# Patient Record
Sex: Female | Born: 2000 | Race: White | Hispanic: No | Marital: Single | Smoking: Never smoker
Health system: Southern US, Community
[De-identification: ages and names within clinical notes are randomized; demographics above are authoritative.]

## PROBLEM LIST (undated history)

## (undated) DIAGNOSIS — F419 Anxiety disorder, unspecified: Secondary | ICD-10-CM

## (undated) DIAGNOSIS — F32A Depression, unspecified: Secondary | ICD-10-CM

---
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
## 2021-01-09 ENCOUNTER — Other Ambulatory Visit: Payer: Self-pay

## 2021-01-09 ENCOUNTER — Encounter: Payer: Self-pay | Admitting: Behavioral Health

## 2021-01-09 ENCOUNTER — Ambulatory Visit: Payer: BC Managed Care – PPO | Admitting: Behavioral Health

## 2021-01-09 DIAGNOSIS — F331 Major depressive disorder, recurrent, moderate: Secondary | ICD-10-CM | POA: Diagnosis not present

## 2021-01-09 DIAGNOSIS — F411 Generalized anxiety disorder: Secondary | ICD-10-CM | POA: Diagnosis not present

## 2021-01-09 NOTE — Progress Notes (Signed)
[facility]  Patient ID: Leslie Gonzalez,  MRN: [id number]  PCP: Gweneth Dimitri, MD  Date of Evaluation: 01/09/2021 Time spent:30 minutes  Chief Complaint:   HISTORY/CURRENT STATUS: HPI 20 year old female presents to this office for follow up and medication management. Her mother Tora Duck is present with her consent. Says she continues to experience depression. She say she has mostly bad days with occasional really good day thrown in. She says she really cannot identify any triggers for the depression, but says she constantly feels the need to be in a relationship. Says she craves nurturing attention and typically looks for it ina relationship with boys. Says she realized that she needs therapy and does not want medication adjustment at this time.  Says she has had some thought of SI. She says she questions "what is the purpose, why do I endure this". She says she does not have a plan and would not do it because of the love for her family. She reports depression 7/10 and anxiety 4/10. Says she is sleeping 7-8 hours per night. She is working  job while out of school for summer. No mania, no psychosis. No HI.   Past psychiatric medication failure: Lexapro 20 mg  Individual Medical History/ Review of Systems: Changes? :No   Allergies: Patient has no known allergies.  Current Medications:  Current Outpatient Medications:    busPIRone (BUSPAR) 10 MG tablet, Take 1 tablet (10 mg total) by mouth 2 (two) times daily after a meal., Disp: 180 tablet, Rfl: 3   norgestimate-ethinyl estradiol (ORTHO-CYCLEN) 0.25-35 MG-MCG tablet, 1 tablet, Disp: , Rfl:    sertraline (ZOLOFT) 100 MG tablet, Take 1 tablet (100 mg total) by mouth daily., Disp: 30 tablet, Rfl: 1   sertraline (ZOLOFT) 50 MG tablet, 1/2 tab 25 mg for 7 days. Then one whole tab. (Patient not taking: Reported on 01/09/2021), Disp: 30 tablet, Rfl: 1 Medication Side Effects: none  Family Medical/ Social History: Changes?  No  MENTAL HEALTH EXAM:  There were no vitals taken for this visit.There is no height or weight on file to calculate BMI.  General Appearance: Casual, Neat, and Well Groomed  Eye Contact:  Good  Speech:  Clear and Coherent  Volume:  Decreased  Mood:  Depressed  Affect:  Depressed and Flat  Thought Process:  Coherent  Orientation:  Full (Time, Place, and Person)  Thought Content: Logical   Suicidal Thoughts:  Yes.  without intent/plan  Homicidal Thoughts:  No  Memory:  WNL  Judgement:  Good  Insight:  Good  Psychomotor Activity:  Normal  Concentration:  Concentration: Good  Recall:  Good  Fund of Knowledge: Good  Language: Good  Assets:  Desire for Improvement  ADL's:  Intact  Cognition: WNL  Prognosis:  Good    DIAGNOSES:    ICD-10-CM   1. Generalized anxiety disorder  F41.1     2. Major depressive disorder, recurrent episode, moderate (HCC)  F33.1       Receiving Psychotherapy: No    RECOMMENDATIONS:  Continue Zoloft to 100 mg daily Will report any side effects or worsening symptoms promptly. To Follow up in 4 weeks to reassess. Greater than 50% of 30 min face to face time with patient was spent on counseling and coordination of care. We discussed patient's current anxiety and depression levels. Patient could not identify any current triggers for recent increase in depression. Patient does not want to adjust medications at this time and is interested in finding  a therapist.   Joan Flores, NP

## 2021-01-10 ENCOUNTER — Other Ambulatory Visit: Payer: Self-pay | Admitting: Behavioral Health

## 2021-01-10 DIAGNOSIS — F331 Major depressive disorder, recurrent, moderate: Secondary | ICD-10-CM

## 2021-01-10 DIAGNOSIS — F411 Generalized anxiety disorder: Secondary | ICD-10-CM

## 2021-02-06 ENCOUNTER — Other Ambulatory Visit: Payer: Self-pay

## 2021-02-06 ENCOUNTER — Ambulatory Visit (INDEPENDENT_AMBULATORY_CARE_PROVIDER_SITE_OTHER): Payer: BC Managed Care – PPO | Admitting: Behavioral Health

## 2021-02-06 ENCOUNTER — Encounter: Payer: Self-pay | Admitting: Behavioral Health

## 2021-02-06 DIAGNOSIS — F331 Major depressive disorder, recurrent, moderate: Secondary | ICD-10-CM | POA: Diagnosis not present

## 2021-02-06 DIAGNOSIS — F951 Chronic motor or vocal tic disorder: Secondary | ICD-10-CM | POA: Diagnosis not present

## 2021-02-06 DIAGNOSIS — F39 Unspecified mood [affective] disorder: Secondary | ICD-10-CM

## 2021-02-06 DIAGNOSIS — F341 Dysthymic disorder: Secondary | ICD-10-CM | POA: Diagnosis not present

## 2021-02-06 DIAGNOSIS — F411 Generalized anxiety disorder: Secondary | ICD-10-CM | POA: Diagnosis not present

## 2021-02-06 MED ORDER — SERTRALINE HCL 100 MG PO TABS: 150.0000 mg | ORAL_TABLET | Freq: Every day | ORAL | 3 refills | Status: DC

## 2021-02-06 MED ORDER — LAMOTRIGINE 50 MG PO TBDP: ORAL_TABLET | ORAL | 1 refills | Status: DC

## 2021-02-06 NOTE — Progress Notes (Signed)
[facility]  Patient ID: Leslie Gonzalez,  MRN: [id number]  PCP: Gweneth Dimitri, MD  Date of Evaluation: 02/06/2021 Time spent:30 minutes  Chief Complaint:  Chief Complaint   Anxiety; Depression; Medication Refill; Medication Problem     HISTORY/CURRENT STATUS: HPI 20 year old female presents to this office for follow up and medication management. She says her moods have been all over the place. Says that she is depressed most of the time, but will have short periods lasting a couple days where she feels great with high energy. Still endorses frequent irritability and racing thoughts. She says that she then crashes and burns and 8 hours of sleep is not enough. Says she does not feel rested. She says she is more aware of her moods fluctuating more rapidly and she never just feels balanced. She says that she notices no difference since increasing the zoloft to 100 mg last visit. She has started therapy and feels like she needs to try medication to better stabilize her moods. She reports anxiety today at 3/10 and depression at 6/10. She denies mania currently. No psychosis. No SI/HI.  Past psychiatric medications trials.   Individual Medical History/ Review of Systems: Changes? :No   Allergies: Patient has no known allergies.  Current Medications:  Current Outpatient Medications:    busPIRone (BUSPAR) 10 MG tablet, Take 1 tablet (10 mg total) by mouth 2 (two) times daily after a meal., Disp: 180 tablet, Rfl: 3   lamoTRIgine 50 MG TBDP, Take 1/2 tablet 25 mg for 7 days. Then one whole tablet 50 mg daily., Disp: 30 tablet, Rfl: 1   norgestimate-ethinyl estradiol (ORTHO-CYCLEN) 0.25-35 MG-MCG tablet, 1 tablet, Disp: , Rfl:    sertraline (ZOLOFT) 100 MG tablet, Take 1.5 tablets (150 mg total) by mouth daily., Disp: 45 tablet, Rfl: 3   sertraline (ZOLOFT) 50 MG tablet, 1/2 tab 25 mg for 7 days. Then one whole tab. (Patient not taking: No sig reported), Disp: 30 tablet, Rfl:  1 Medication Side Effects: none  Family Medical/ Social History: Changes? No  MENTAL HEALTH EXAM:  There were no vitals taken for this visit.There is no height or weight on file to calculate BMI.  General Appearance: Casual, Neat, and Well Groomed  Eye Contact:  Good  Speech:  Clear and Coherent  Volume:  Normal  Mood:  Anxious, Depressed, and Dysphoric  Affect:  Flat, Restricted, and Anxious  Thought Process:  Coherent  Orientation:  Full (Time, Place, and Person)  Thought Content: Logical   Suicidal Thoughts:  No  Homicidal Thoughts:  No  Memory:  WNL  Judgement:  Fair  Insight:  Fair  Psychomotor Activity:  Normal  Concentration:  Concentration: Good  Recall:  Fair  Fund of Knowledge: Good  Language: Good  Assets:  Desire for Improvement  ADL's:  Intact  Cognition: WNL  Prognosis:  Good    DIAGNOSES:    ICD-10-CM   1. Generalized anxiety disorder  F41.1 lamoTRIgine 50 MG TBDP    sertraline (ZOLOFT) 100 MG tablet    2. Major depressive disorder, recurrent episode, moderate (HCC)  F33.1 lamoTRIgine 50 MG TBDP    sertraline (ZOLOFT) 100 MG tablet    3. Chronic motor tic disorder  F95.1 lamoTRIgine 50 MG TBDP    4. 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 Unspecified mood (affective) disorder (HCC)  F39 lamoTRIgine 50 MG  TBDP      Receiving Psychotherapy: Yes    RECOMMENDATIONS:   Increase Zoloft to 150 mg daily To start Lamictal 25 mg for 7 days, then 50 mg daily. Will report any side effects or worsening symptoms promptly. To Follow up in 4 weeks to reassess. Greater than 50% of 30 min face to face time with patient was spent on counseling and coordination of care. We discussed patient's current anxiety and depression levels. Patient could not identify any current triggers for recent increase in depression.  Patient does not  note any improvement since last visit. Patient had discussion with therapist and wants to try mood stabilizer due to report of inconsistent and rapidly cycling moods. Counseled patient regarding potential benefits, risks, and side effects of Lamictal to include potential risk of Stevens-Johnson syndrome. Advised patient to stop taking Lamictal and contact office immediately if rash develops and to seek urgent medical attention if rash is severe and/or spreading quickly.    Joan Flores, NP

## 2021-02-28 ENCOUNTER — Other Ambulatory Visit: Payer: Self-pay | Admitting: Behavioral Health

## 2021-02-28 DIAGNOSIS — F331 Major depressive disorder, recurrent, moderate: Secondary | ICD-10-CM

## 2021-02-28 DIAGNOSIS — F411 Generalized anxiety disorder: Secondary | ICD-10-CM

## 2021-03-07 ENCOUNTER — Ambulatory Visit (INDEPENDENT_AMBULATORY_CARE_PROVIDER_SITE_OTHER): Payer: BC Managed Care – PPO | Admitting: Behavioral Health

## 2021-03-07 ENCOUNTER — Encounter: Payer: Self-pay | Admitting: Behavioral Health

## 2021-03-07 ENCOUNTER — Other Ambulatory Visit: Payer: Self-pay

## 2021-03-07 DIAGNOSIS — F951 Chronic motor or vocal tic disorder: Secondary | ICD-10-CM | POA: Diagnosis not present

## 2021-03-07 DIAGNOSIS — F411 Generalized anxiety disorder: Secondary | ICD-10-CM | POA: Diagnosis not present

## 2021-03-07 DIAGNOSIS — F331 Major depressive disorder, recurrent, moderate: Secondary | ICD-10-CM

## 2021-03-07 MED ORDER — LAMOTRIGINE 100 MG PO TABS: 100.0000 mg | ORAL_TABLET | Freq: Every day | ORAL | 3 refills | Status: DC

## 2021-03-07 MED ORDER — SERTRALINE HCL 100 MG PO TABS: ORAL_TABLET | ORAL | 3 refills | Status: DC

## 2021-03-07 NOTE — Progress Notes (Signed)
[facility]  Patient ID: Leslie Gonzalez,  MRN: [id number]  PCP: Gweneth Dimitri, MD  Date of Evaluation: 03/07/2021 Time spent:20 minutes  Chief Complaint:  Chief Complaint   Anxiety; Depression; Medication Refill; Follow-up     HISTORY/CURRENT STATUS: HPI 20 year old female presents to this office for follow up and medication management. She says her moods have improved moderately. Says that depressive episodes come about 2 days per week but says they are less intense and she is able to cope more efficiently. Says that irritability and racing thoughts has diminished.  She feel like she could further benefit from an increase in the Lamictal which she believe is helping. She reports anxiety today at 3/10 and depression at 3/10. She denies mania currently. No mania, no psychosis. No SI/HI.  No previous medication trials  Individual Medical History/ Review of Systems: Changes? :No   Allergies: Patient has no known allergies.  Current Medications:  Current Outpatient Medications:    lamoTRIgine (LAMICTAL) 100 MG tablet, Take 1 tablet (100 mg total) by mouth daily., Disp: 30 tablet, Rfl: 3   busPIRone (BUSPAR) 10 MG tablet, Take 1 tablet (10 mg total) by mouth 2 (two) times daily after a meal., Disp: 180 tablet, Rfl: 3   lamoTRIgine 50 MG TBDP, Take 1/2 tablet 25 mg for 7 days. Then one whole tablet 50 mg daily., Disp: 30 tablet, Rfl: 1   norgestimate-ethinyl estradiol (ORTHO-CYCLEN) 0.25-35 MG-MCG tablet, 1 tablet, Disp: , Rfl:    sertraline (ZOLOFT) 100 MG tablet, TAKE 1.5 TABLETS (150MG  TOTAL) BY MOUTH DAILY, Disp: 135 tablet, Rfl: 3   sertraline (ZOLOFT) 50 MG tablet, 1/2 tab 25 mg for 7 days. Then one whole tab. (Patient not taking: No sig reported), Disp: 30 tablet, Rfl: 1 Medication Side Effects: none  Family Medical/ Social History: Changes? No  MENTAL HEALTH EXAM:  There were no vitals taken for this visit.There is no height or weight on file to calculate  BMI.  General Appearance: Casual, Neat, and Well Groomed  Eye Contact:  Good  Speech:  Clear and Coherent  Volume:  Normal  Mood:  Anxious  Affect:  Appropriate  Thought Process:  Coherent  Orientation:  Full (Time, Place, and Person)  Thought Content: Logical   Suicidal Thoughts:  No  Homicidal Thoughts:  No  Memory:  WNL  Judgement:  Good  Insight:  Good  Psychomotor Activity:  Normal  Concentration:  Concentration: Good  Recall:  Good  Fund of Knowledge: Good  Language: Good  Assets:  Desire for Improvement  ADL's:  Intact  Cognition: WNL  Prognosis:  Good    DIAGNOSES:    ICD-10-CM   1. Generalized anxiety disorder  F41.1 lamoTRIgine (LAMICTAL) 100 MG tablet    sertraline (ZOLOFT) 100 MG tablet    2. Major depressive disorder, recurrent episode, moderate (HCC)  F33.1 lamoTRIgine (LAMICTAL) 100 MG tablet    sertraline (ZOLOFT) 100 MG tablet    3. Chronic motor tic disorder  F95.1       Receiving Psychotherapy: No    RECOMMENDATIONS:  Increase Zoloft to 150 mg daily Increase Lamictal to 100 mg daily Continue Buspar 10 mg twice daily.  Will report any side effects or worsening symptoms promptly. To Follow up in 3 months to reassess. Greater than 50% of 20 min face to face time with patient was spent on counseling and coordination of care. We discussed patient's current anxiety and depression levels Has experience improvement with moods and reports less  frequency and duration. Agrees that an increase in Lamictal could be beneficial. Counseled patient regarding potential benefits, risks, and side effects of Lamictal to include potential risk of Stevens-Johnson syndrome. Advised patient to stop taking Lamictal and contact office immediately if rash develops and to seek urgent medical attention if rash is severe and/or spreading quickly.    Joan Flores, NP

## 2021-03-29 ENCOUNTER — Other Ambulatory Visit: Payer: Self-pay | Admitting: Behavioral Health

## 2021-03-29 DIAGNOSIS — F411 Generalized anxiety disorder: Secondary | ICD-10-CM

## 2021-03-29 DIAGNOSIS — F331 Major depressive disorder, recurrent, moderate: Secondary | ICD-10-CM

## 2021-06-06 ENCOUNTER — Ambulatory Visit: Payer: BC Managed Care – PPO | Admitting: Behavioral Health

## 2021-06-13 ENCOUNTER — Other Ambulatory Visit: Payer: Self-pay | Admitting: Psychiatry

## 2021-06-13 ENCOUNTER — Other Ambulatory Visit: Payer: Self-pay | Admitting: Behavioral Health

## 2021-06-13 DIAGNOSIS — F411 Generalized anxiety disorder: Secondary | ICD-10-CM

## 2021-06-13 DIAGNOSIS — F331 Major depressive disorder, recurrent, moderate: Secondary | ICD-10-CM

## 2021-06-15 ENCOUNTER — Encounter: Payer: Self-pay | Admitting: Behavioral Health

## 2021-06-15 ENCOUNTER — Ambulatory Visit (INDEPENDENT_AMBULATORY_CARE_PROVIDER_SITE_OTHER): Payer: BC Managed Care – PPO | Admitting: Behavioral Health

## 2021-06-15 DIAGNOSIS — F39 Unspecified mood [affective] disorder: Secondary | ICD-10-CM | POA: Diagnosis not present

## 2021-06-15 DIAGNOSIS — F331 Major depressive disorder, recurrent, moderate: Secondary | ICD-10-CM

## 2021-06-15 DIAGNOSIS — F411 Generalized anxiety disorder: Secondary | ICD-10-CM

## 2021-06-15 MED ORDER — BUSPIRONE HCL 10 MG PO TABS: 10.0000 mg | ORAL_TABLET | Freq: Two times a day (BID) | ORAL | 2 refills | Status: DC

## 2021-06-15 MED ORDER — SERTRALINE HCL 100 MG PO TABS
ORAL_TABLET | ORAL | 2 refills | Status: DC
Start: 2021-06-15 — End: 2022-04-15

## 2021-06-15 MED ORDER — LAMOTRIGINE 100 MG PO TABS: 100.0000 mg | ORAL_TABLET | Freq: Every day | ORAL | 1 refills | Status: DC

## 2021-06-15 NOTE — Progress Notes (Signed)
[facility]  Patient ID: Leslie Gonzalez,  MRN: [id number]  PCP: Cari Caraway, MD  Date of Evaluation: 06/15/2021 Time spent:20 minutes  Chief Complaint:  Chief Complaint   Depression; Anxiety; Follow-up; Medication Refill     HISTORY/CURRENT STATUS: HPI 20 year old female presents to this office for follow up and medication management. She says her moods have improved moderately. Is happy with her medications and she is feeling stable at this time. Says that she just finished up this semester of school and made straight A's. She had a tosillectomy this week and is at home healing. Says that irritability and racing thoughts has diminished. She is requesting refills and a 3 month follow up.She says she does not need any med adjustments at this time. She reports anxiety today at 2/10 and depression at 2/10. She is currently taking RX pain medication post-op.  She denies mania currently. No mania, no psychosis. No SI/HI.   No previous medication trials   Individual Medical History/ Review of Systems: Changes? :No   Allergies: Patient has no known allergies.  Current Medications:  Current Outpatient Medications:    busPIRone (BUSPAR) 10 MG tablet, Take 1 tablet (10 mg total) by mouth 2 (two) times daily after a meal., Disp: 180 tablet, Rfl: 2   lamoTRIgine (LAMICTAL) 100 MG tablet, Take 1 tablet (100 mg total) by mouth daily., Disp: 90 tablet, Rfl: 1   norgestimate-ethinyl estradiol (ORTHO-CYCLEN) 0.25-35 MG-MCG tablet, 1 tablet, Disp: , Rfl:    sertraline (ZOLOFT) 100 MG tablet, TAKE 1.5 TABLETS (150MG  TOTAL) BY MOUTH DAILY, Disp: 135 tablet, Rfl: 2 Medication Side Effects: none  Family Medical/ Social History: Changes? No  MENTAL HEALTH EXAM:  There were no vitals taken for this visit.There is no height or weight on file to calculate BMI.  General Appearance: Casual  Eye Contact:  Good  Speech:  Clear and Coherent  Volume:  Normal  Mood:  NA  Affect:   Appropriate  Thought Process:  Coherent  Orientation:  Full (Time, Place, and Person)  Thought Content: Logical   Suicidal Thoughts:  No  Homicidal Thoughts:  No  Memory:  WNL  Judgement:  Good  Insight:  Good  Psychomotor Activity:  Normal  Concentration:  Concentration: Good  Recall:  Good  Fund of Knowledge: Good  Language: Good  Assets:  Desire for Improvement  ADL's:  Intact  Cognition: WNL  Prognosis:  Good    DIAGNOSES:    ICD-10-CM   1. Unspecified mood (affective) disorder (HCC)  F39     2. Generalized anxiety disorder  F41.1 busPIRone (BUSPAR) 10 MG tablet    sertraline (ZOLOFT) 100 MG tablet    lamoTRIgine (LAMICTAL) 100 MG tablet    3. Major depressive disorder, recurrent episode, moderate (HCC)  F33.1 sertraline (ZOLOFT) 100 MG tablet    lamoTRIgine (LAMICTAL) 100 MG tablet      Receiving Psychotherapy: No   RECOMMENDATIONS:   Continue Zoloft 150 mg daily Continue Lamictal 100 mg daily Continue Buspar 10 mg twice daily.  Will report any side effects or worsening symptoms promptly. To Follow up in 3 months to reassess. Greater than 50% of 20 min face to face time with patient was spent on counseling and coordination of care. We discussed patient's current anxiety and depression levels Has experience improvement with moods and reports less frequency and duration. Agrees that an increase in Lamictal could be beneficial. Counseled patient regarding potential benefits, risks, and side effects of Lamictal to include  potential risk of Stevens-Johnson syndrome. Advised patient to stop taking Lamictal and contact office immediately if rash develops and to seek urgent medical attention if rash is severe and/or spreading quickly.           Joan Flores, NP

## 2021-08-31 ENCOUNTER — Other Ambulatory Visit: Payer: Self-pay

## 2021-08-31 ENCOUNTER — Other Ambulatory Visit (HOSPITAL_BASED_OUTPATIENT_CLINIC_OR_DEPARTMENT_OTHER): Payer: Self-pay | Admitting: Family Medicine

## 2021-08-31 ENCOUNTER — Ambulatory Visit (HOSPITAL_BASED_OUTPATIENT_CLINIC_OR_DEPARTMENT_OTHER)
Admission: RE | Admit: 2021-08-31 | Discharge: 2021-08-31 | Disposition: A | Discharge status: Home / Self Care | Payer: BC Managed Care – PPO | Source: Ambulatory Visit | Attending: Family Medicine | Admitting: Family Medicine

## 2021-08-31 DIAGNOSIS — R1084 Generalized abdominal pain: Secondary | ICD-10-CM | POA: Insufficient documentation

## 2021-09-02 ENCOUNTER — Other Ambulatory Visit: Payer: Self-pay

## 2021-09-02 ENCOUNTER — Emergency Department (HOSPITAL_COMMUNITY)
Admission: EM | Admit: 2021-09-02 | Discharge: 2021-09-02 | Disposition: A | Discharge status: Home / Self Care | Payer: BC Managed Care – PPO | Attending: Emergency Medicine | Admitting: Emergency Medicine

## 2021-09-02 ENCOUNTER — Emergency Department (HOSPITAL_COMMUNITY): Payer: BC Managed Care – PPO

## 2021-09-02 ENCOUNTER — Encounter (HOSPITAL_COMMUNITY): Payer: Self-pay | Admitting: Emergency Medicine

## 2021-09-02 DIAGNOSIS — R197 Diarrhea, unspecified: Secondary | ICD-10-CM | POA: Insufficient documentation

## 2021-09-02 DIAGNOSIS — R112 Nausea with vomiting, unspecified: Secondary | ICD-10-CM | POA: Insufficient documentation

## 2021-09-02 DIAGNOSIS — D72829 Elevated white blood cell count, unspecified: Secondary | ICD-10-CM | POA: Diagnosis not present

## 2021-09-02 DIAGNOSIS — R1031 Right lower quadrant pain: Secondary | ICD-10-CM | POA: Diagnosis present

## 2021-09-02 HISTORY — DX: Depression, unspecified: F32.A

## 2021-09-02 HISTORY — DX: Anxiety disorder, unspecified: F41.9

## 2021-09-02 LAB — URINALYSIS, ROUTINE W REFLEX MICROSCOPIC
Bilirubin Urine: NEGATIVE
Glucose, UA: NEGATIVE mg/dL
Hgb urine dipstick: NEGATIVE
Ketones, ur: NEGATIVE mg/dL
Leukocytes,Ua: NEGATIVE
Nitrite: NEGATIVE
Protein, ur: NEGATIVE mg/dL
Specific Gravity, Urine: >1.046 — ABNORMAL HIGH (ref 1.005–1.030)
pH: 6 (ref 5.0–8.0)

## 2021-09-02 LAB — I-STAT BETA HCG BLOOD, ED (MC, WL, AP ONLY): I-stat hCG, quantitative: <5 m[IU]/mL (ref <5)

## 2021-09-02 LAB — CBC WITH DIFFERENTIAL/PLATELET
Abs Immature Granulocytes: 0.06 10*3/uL (ref 0.00–0.07)
Basophils Absolute: 0 10*3/uL (ref 0.0–0.1)
Basophils Relative: 0 %
Eosinophils Absolute: 0.3 10*3/uL (ref 0.0–0.5)
Eosinophils Relative: 2 %
HCT: 44 % (ref 36.0–46.0)
Hemoglobin: 14.5 g/dL (ref 12.0–15.0)
Immature Granulocytes: 0 %
Lymphocytes Relative: 6 %
Lymphs Abs: 1 10*3/uL (ref 0.7–4.0)
MCH: 28.6 pg (ref 26.0–34.0)
MCHC: 33 g/dL (ref 30.0–36.0)
MCV: 86.8 fL (ref 80.0–100.0)
Monocytes Absolute: 1.3 10*3/uL — ABNORMAL HIGH (ref 0.1–1.0)
Monocytes Relative: 8 %
Neutro Abs: 13.4 10*3/uL — ABNORMAL HIGH (ref 1.7–7.7)
Neutrophils Relative %: 84 %
Platelets: 262 10*3/uL (ref 150–400)
RBC: 5.07 MIL/uL (ref 3.87–5.11)
RDW: 12 % (ref 11.5–15.5)
WBC: 16.1 10*3/uL — ABNORMAL HIGH (ref 4.0–10.5)
nRBC: 0 % (ref 0.0–0.2)

## 2021-09-02 LAB — COMPREHENSIVE METABOLIC PANEL
ALT: 12 U/L (ref 0–44)
AST: 22 U/L (ref 15–41)
Albumin: 3.8 g/dL (ref 3.5–5.0)
Alkaline Phosphatase: 72 U/L (ref 38–126)
Anion gap: 10 (ref 5–15)
BUN: 12 mg/dL (ref 6–20)
CO2: 20 mmol/L — ABNORMAL LOW (ref 22–32)
Calcium: 9.3 mg/dL (ref 8.9–10.3)
Chloride: 108 mmol/L (ref 98–111)
Creatinine, Ser: 0.77 mg/dL (ref 0.44–1.00)
GFR, Estimated: >60 mL/min (ref >60)
Glucose, Bld: 95 mg/dL (ref 70–99)
Potassium: 4.4 mmol/L (ref 3.5–5.1)
Sodium: 138 mmol/L (ref 135–145)
Total Bilirubin: 1.1 mg/dL (ref 0.3–1.2)
Total Protein: 6.9 g/dL (ref 6.5–8.1)

## 2021-09-02 LAB — LIPASE, BLOOD: Lipase: 34 U/L (ref 11–51)

## 2021-09-02 MED ORDER — HALOPERIDOL LACTATE 5 MG/ML IJ SOLN
2.0000 mg | Freq: Once | INTRAMUSCULAR | Status: AC
  Administered 2021-09-02: 2 mg via INTRAVENOUS
  Filled 2021-09-02: qty 1

## 2021-09-02 MED ORDER — MORPHINE SULFATE (PF) 4 MG/ML IV SOLN: 4.0000 mg | INTRAVENOUS | Status: DC | PRN

## 2021-09-02 MED ORDER — SODIUM CHLORIDE 0.9 % IV BOLUS
1000.0000 mL | Freq: Once | INTRAVENOUS | Status: AC
  Administered 2021-09-02: 1000 mL via INTRAVENOUS

## 2021-09-02 MED ORDER — ONDANSETRON HCL 4 MG/2ML IJ SOLN
4.0000 mg | Freq: Once | INTRAMUSCULAR | Status: AC
  Administered 2021-09-02: 4 mg via INTRAVENOUS
  Filled 2021-09-02: qty 2

## 2021-09-02 MED ORDER — ONDANSETRON HCL 4 MG/2ML IJ SOLN
4.0000 mg | Freq: Four times a day (QID) | INTRAMUSCULAR | Status: DC | PRN
  Administered 2021-09-02: 4 mg via INTRAVENOUS
  Filled 2021-09-02: qty 2

## 2021-09-02 MED ORDER — IOHEXOL 300 MG/ML  SOLN
100.0000 mL | Freq: Once | INTRAMUSCULAR | Status: AC | PRN
  Administered 2021-09-02: 100 mL via INTRAVENOUS

## 2021-09-02 MED ORDER — ONDANSETRON 4 MG PO TBDP: 4.0000 mg | ORAL_TABLET | Freq: Three times a day (TID) | ORAL | 0 refills | Status: AC | PRN

## 2021-09-02 MED ORDER — DIPHENHYDRAMINE HCL 50 MG/ML IJ SOLN
12.5000 mg | Freq: Once | INTRAMUSCULAR | Status: AC
  Administered 2021-09-02: 12.5 mg via INTRAVENOUS
  Filled 2021-09-02: qty 1

## 2021-09-02 MED ORDER — PROCHLORPERAZINE 25 MG RE SUPP: 25.0000 mg | Freq: Two times a day (BID) | RECTAL | 0 refills | Status: AC | PRN

## 2021-09-02 MED ORDER — PROCHLORPERAZINE EDISYLATE 10 MG/2ML IJ SOLN
10.0000 mg | Freq: Once | INTRAMUSCULAR | Status: AC
  Administered 2021-09-02: 10 mg via INTRAVENOUS
  Filled 2021-09-02: qty 2

## 2021-09-02 NOTE — Discharge Instructions (Addendum)
Zofran dissolvable tablet or Compazine suppository as needed as prescribed. Clear liquid diet, advance slowly to bland foods as tolerated. Return to the emergency room if unable to tolerate liquids by mouth with assistance for medications or for any worsening or concerning symptoms.

## 2021-09-02 NOTE — ED Notes (Signed)
Patient transported to CT 

## 2021-09-02 NOTE — ED Notes (Signed)
Pt ambulated to restroom with steady gait. Provided pt with urine sample cup

## 2021-09-02 NOTE — ED Provider Notes (Signed)
[facility] EMERGENCY DEPARTMENT Provider Note   CSN: [id number] Arrival date & time: 09/02/21  [id number]     History  Chief Complaint  Patient presents with   Abdominal Pain    Leslie Gonzalez is a 21 y.o. female.  21 year old female presents with mom with complaint of right lower quadrant abdominal pain x1 week, occasionally radiates towards periumbilical area.  Pain has been intermittent, burning in nature, worse with standing for prolonged time.  Patient went to PCP on Friday who sent her for pelvic ultrasound which showed a small ovarian cyst which is not thought to be related to her pain.  Patient woke up at 5 AM today with nausea and vomiting with worsening pain which prompted her to come to the emergency room.  She denies fevers, chills, changes in bladder habits, abnormal vaginal discharge or change in appetite.  States that she was constipated for the past week.  No prior abdominal surgeries.  Does have past medical history of depression and anxiety. Took ibuprofen shortly before coming to the emergency room today, does not need pain medication at this time however as needed orders given in case her knee changes.      Home Medications Prior to Admission medications   Medication Sig Start Date End Date Taking? Authorizing Provider  ondansetron (ZOFRAN-ODT) 4 MG disintegrating tablet Take 1 tablet (4 mg total) by mouth every 8 (eight) hours as needed for nausea or vomiting. 09/02/21  Yes Tacy Learn, PA-C  prochlorperazine (COMPAZINE) 25 MG suppository Place 1 suppository (25 mg total) rectally every 12 (twelve) hours as needed for nausea or vomiting. 09/02/21  Yes Tacy Learn, PA-C  busPIRone (BUSPAR) 10 MG tablet Take 1 tablet (10 mg total) by mouth 2 (two) times daily after a meal. 06/15/21   Elwanda Brooklyn, NP  lamoTRIgine (LAMICTAL) 100 MG tablet Take 1 tablet (100 mg total) by mouth daily. 06/15/21   Elwanda Brooklyn, NP  norgestimate-ethinyl estradiol  (ORTHO-CYCLEN) 0.25-35 MG-MCG tablet 1 tablet    [provider]  sertraline (ZOLOFT) 100 MG tablet TAKE 1.5 TABLETS (150MG  TOTAL) BY MOUTH DAILY 06/15/21   Elwanda Brooklyn, NP      Allergies    Patient has no known allergies.    Review of Systems   Review of Systems Negative except as per HPI Physical Exam Updated Vital Signs BP 121/76    Pulse 80    Temp 98.9 F (37.2 C) (Oral)    Resp 16    Ht 5\' 10"  (1.778 m)    Wt 86.2 kg    LMP 08/15/2021    SpO2 100%    BMI 27.26 kg/m  Physical Exam Vitals and nursing note reviewed.  Constitutional:      General: She is not in acute distress.    Appearance: She is well-developed. She is not diaphoretic.  HENT:     Head: Normocephalic and atraumatic.  Cardiovascular:     Rate and Rhythm: Normal rate and regular rhythm.     Heart sounds: Normal heart sounds.  Pulmonary:     Effort: Pulmonary effort is normal.     Breath sounds: Normal breath sounds.  Abdominal:     Palpations: Abdomen is soft.     Tenderness: There is abdominal tenderness in the right lower quadrant. There is rebound. There is no right CVA tenderness, left CVA tenderness or guarding. Positive signs include psoas sign.  Skin:    General: Skin is warm and dry.  Findings: No erythema or rash.  Neurological:     Mental Status: She is alert and oriented to person, place, and time.  Psychiatric:        Behavior: Behavior normal.    ED Results / Procedures / Treatments   Labs (all labs ordered are listed, but only abnormal results are displayed) Labs Reviewed  CBC WITH DIFFERENTIAL/PLATELET - Abnormal; Notable for the following components:      Result Value   WBC 16.1 (*)    Neutro Abs 13.4 (*)    Monocytes Absolute 1.3 (*)    All other components within normal limits  COMPREHENSIVE METABOLIC PANEL - Abnormal; Notable for the following components:   CO2 20 (*)    All other components within normal limits  URINALYSIS, ROUTINE W REFLEX MICROSCOPIC -  Abnormal; Notable for the following components:   APPearance HAZY (*)    Specific Gravity, Urine >1.046 (*)    All other components within normal limits  LIPASE, BLOOD  I-STAT BETA HCG BLOOD, ED ([facility], [facility], [facility] ONLY)    EKG None  Radiology CT Abdomen Pelvis W Contrast  Result Date: 09/02/2021 CLINICAL DATA:  21 year old female with history of right lower quadrant abdominal pain for 1 week. Nausea and vomiting. EXAM: CT ABDOMEN AND PELVIS WITH CONTRAST TECHNIQUE: Multidetector CT imaging of the abdomen and pelvis was performed using the standard protocol following bolus administration of intravenous contrast. RADIATION DOSE REDUCTION: This exam was performed according to the departmental dose-optimization program which includes automated exposure control, adjustment of the mA and/or kV according to patient size and/or use of iterative reconstruction technique. CONTRAST:  166mL OMNIPAQUE IOHEXOL 300 MG/ML  SOLN COMPARISON:  No priors. FINDINGS: Lower chest: Unremarkable. Hepatobiliary: No suspicious cystic or solid hepatic lesions. No intra or extrahepatic biliary ductal dilatation. Gallbladder is normal in appearance. Pancreas: No pancreatic mass. No pancreatic ductal dilatation. No pancreatic or peripancreatic fluid collections or inflammatory changes. Spleen: Unremarkable. Adrenals/Urinary Tract: Bilateral kidneys and bilateral adrenal glands are normal in appearance. No hydroureteronephrosis. Urinary bladder is normal in appearance. Stomach/Bowel: Unenhanced appearance of the stomach is normal. No pathologic dilatation of small bowel or colon. Normal appendix. Vascular/Lymphatic: No significant atherosclerotic disease, aneurysm or dissection noted in the abdominal or pelvic vasculature. No lymphadenopathy noted in the abdomen or pelvis. Reproductive: Uterus and ovaries are unremarkable in appearance. Other: No significant volume of ascites.  No pneumoperitoneum. Musculoskeletal: There are no aggressive  appearing lytic or blastic lesions noted in the visualized portions of the skeleton. IMPRESSION: 1. No acute findings are noted in the abdomen or pelvis to account for the patient's symptoms. Specifically, the appendix is normal. Electronically Signed   By: Vinnie Langton M.D.   On: 09/02/2021 09:50   US PELVIC COMPLETE WITH TRANSVAGINAL  Result Date: 08/31/2021 CLINICAL DATA:  Right lower quadrant pain radiating across the pelvis x4 days. EXAM: TRANSABDOMINAL AND TRANSVAGINAL ULTRASOUND OF PELVIS TECHNIQUE: Both transabdominal and transvaginal ultrasound examinations of the pelvis were performed. Transabdominal technique was performed for global imaging of the pelvis including uterus, ovaries, adnexal regions, and pelvic cul-de-sac. It was necessary to proceed with endovaginal exam following the transabdominal exam to visualize the uterus, endometrium, bilateral ovaries and bilateral adnexa. COMPARISON:  None FINDINGS: Uterus Measurements: 8.0 cm x 3.1 cm x 4.3 cm = volume: 55.2 mL. No fibroids or other mass visualized. A small amount of fluid is seen within the cervical canal. Endometrium Thickness: 3 mm.  No focal abnormality visualized. Right ovary Measurements: 3.9 cm x  3.0 cm x 2.9 cm = volume: 17.6 ML. 2.1 cm x 1.7 cm x 1.8 cm and 2.4 cm x 2.3 cm x 1.8 cm anechoic structures are seen within the right ovary. No abnormal flow is seen within these regions on color Doppler evaluation. Left ovary Measurements: 3.1 cm x 1.7 cm x 2.1 cm = volume: 5.6 mL. Normal appearance/no adnexal mass. Other findings No abnormal free fluid. IMPRESSION: 1. Small amount of fluid within the cervical canal. 2. Small right ovarian cysts. Electronically Signed   By: Virgina Norfolk M.D.   On: 08/31/2021 19:34    Procedures Procedures    Medications Ordered in ED Medications  morphine (PF) 4 MG/ML injection 4 mg (has no administration in time range)  ondansetron (ZOFRAN) injection 4 mg (4 mg Intravenous Given 09/02/21  0816)  sodium chloride 0.9 % bolus 1,000 mL (0 mLs Intravenous Stopped 09/02/21 0941)  iohexol (OMNIPAQUE) 300 MG/ML solution 100 mL (100 mLs Intravenous Contrast Given 09/02/21 0934)  ondansetron (ZOFRAN) injection 4 mg (4 mg Intravenous Given 09/02/21 1038)  prochlorperazine (COMPAZINE) injection 10 mg (10 mg Intravenous Given 09/02/21 1108)  sodium chloride 0.9 % bolus 1,000 mL (0 mLs Intravenous Stopped 09/02/21 1402)  haloperidol lactate (HALDOL) injection 2 mg (2 mg Intravenous Given 09/02/21 1358)  diphenhydrAMINE (BENADRYL) injection 12.5 mg (12.5 mg Intravenous Given 09/02/21 1358)    ED Course/ Medical Decision Making/ A&P                           Medical Decision Making Amount and/or Complexity of Data Reviewed Labs: ordered. Radiology: ordered.  Risk Prescription drug management.  This patient presents to the ED for concern of right lower quad abdominal pain with nausea and vomiting, this involves an extensive number of treatment options, and is a complaint that carries with it a high risk of complications and morbidity.  The differential diagnosis includes but not limited to ovarian torsion, appendicitis, ovarian cyst, renal stone, UTI, pyelonephritis   Co morbidities that complicate the patient evaluation  Depression, anxiety   Additional history obtained:  Additional history obtained from patient's mother at bedside External records from outside source obtained and reviewed including ultrasound completed earlier today's showing small right ovarian cyst, normal Doppler flow to both ovaries   Lab Tests:  I Ordered, and personally interpreted labs.  The pertinent results include: CBC with white count of 16,000, possibly related to vomiting.  CMP without significant electrolyte derangement, does show normal renal and hepatic function.  Lipase normal.  hCG negative.  Urinalysis is hazy otherwise negative for urinary tract infection   Imaging Studies ordered:  I ordered  imaging studies including CT abdomen pelvis with contrast Read by radiology, no acute abnormality, appendix is visualized and is normal. I agree with the radiologist interpretation   Medicines ordered and prescription drug management:  I ordered medication including Zofran, Compazine, Haldol, Benadryl for vomiting Reevaluation of the patient after these medicines showed that the patient resolved I have reviewed the patients home medicines and have made adjustments as needed   Problem List / ED Course:  21 year old female brought in by mom with nausea, vomiting, right lower quadrant pain.  On exam does have rebound tenderness with a positive psoas sign concerning for appendicitis.  CT abdomen pelvis shows a normal appendix.  Her labs are overall reassuring with nonspecific leukocytosis at 16,000.  Vitals reviewed and are normal, afebrile.  While in the department, patient did develop diarrhea.  She was given IV fluids, Zofran.  She continued to vomit.  Patient was given Compazine with control of her vomiting and was placed up for discharge however at time of discharge began vomiting again and was given Haldol with Benadryl.  At that point, vomiting was controlled and patient was ready for discharge home.  She is discharged with prescriptions for Compazine and Zofran, advised her 1 or the other.  Given return to ER precautions with plan to recheck with PCP.   Reevaluation:  After the interventions noted above, I reevaluated the patient and found that they have :improved  Dispostion:  After consideration of the diagnostic results and the patients response to treatment, I feel that the patent would benefit from dc home to rest and hydrate, given zofran and compazine suppositories- advised to use one or the other- return to ER for worsening or concerning symptoms.           Final Clinical Impression(s) / ED Diagnoses Final diagnoses:  Right lower quadrant abdominal pain  Nausea  vomiting and diarrhea    Rx / DC Orders ED Discharge Orders          Ordered    prochlorperazine (COMPAZINE) 25 MG suppository  Every 12 hours PRN        09/02/21 1319    ondansetron (ZOFRAN-ODT) 4 MG disintegrating tablet  Every 8 hours PRN        09/02/21 1319              Tacy Learn, PA-C 09/02/21 1519    Varney Biles, MD 09/03/21 [phone]

## 2021-09-02 NOTE — ED Notes (Signed)
Pt states she is still nauseous and would like some water notified EDP  EDP placed medication in MAR and PO challenge

## 2021-09-02 NOTE — ED Notes (Signed)
Pt has vomited twice approximately 300 ml total and she stated she had diarrhea earlier when she went to the bathroom. Nausea has improved. She is still receiving her bolus. EDP aware

## 2021-09-02 NOTE — ED Notes (Signed)
Pt stated she felt sick to the stomach. Provided pt Zofran and emesis bag.

## 2021-09-02 NOTE — ED Triage Notes (Signed)
Pt reports RLQ pain x 1 week.  Pain worse this morning with nausea and vomiting.  States she had [place] that showed R ovarian cyst.

## 2021-12-08 ENCOUNTER — Other Ambulatory Visit: Payer: Self-pay | Admitting: Behavioral Health

## 2021-12-08 DIAGNOSIS — F411 Generalized anxiety disorder: Secondary | ICD-10-CM

## 2021-12-08 DIAGNOSIS — F331 Major depressive disorder, recurrent, moderate: Secondary | ICD-10-CM

## 2021-12-10 NOTE — Telephone Encounter (Signed)
Please schedule appt

## 2021-12-11 NOTE — Telephone Encounter (Signed)
Voice mail is full  

## 2022-03-30 ENCOUNTER — Other Ambulatory Visit: Payer: Self-pay | Admitting: Behavioral Health

## 2022-03-30 DIAGNOSIS — F411 Generalized anxiety disorder: Secondary | ICD-10-CM

## 2022-03-30 DIAGNOSIS — F331 Major depressive disorder, recurrent, moderate: Secondary | ICD-10-CM

## 2022-04-01 NOTE — Telephone Encounter (Signed)
Please schedule appt

## 2022-04-03 NOTE — Telephone Encounter (Signed)
Pt has an appointment on 10/09

## 2022-04-15 ENCOUNTER — Encounter: Payer: Self-pay | Admitting: Behavioral Health

## 2022-04-15 ENCOUNTER — Ambulatory Visit (INDEPENDENT_AMBULATORY_CARE_PROVIDER_SITE_OTHER): Payer: BC Managed Care – PPO | Admitting: Behavioral Health

## 2022-04-15 DIAGNOSIS — F411 Generalized anxiety disorder: Secondary | ICD-10-CM

## 2022-04-15 DIAGNOSIS — F331 Major depressive disorder, recurrent, moderate: Secondary | ICD-10-CM

## 2022-04-15 MED ORDER — LAMOTRIGINE 100 MG PO TABS: 100.0000 mg | ORAL_TABLET | Freq: Every day | ORAL | 1 refills | Status: DC

## 2022-04-15 MED ORDER — BUSPIRONE HCL 10 MG PO TABS: 10.0000 mg | ORAL_TABLET | Freq: Two times a day (BID) | ORAL | 2 refills | Status: DC

## 2022-04-15 MED ORDER — SERTRALINE HCL 100 MG PO TABS: ORAL_TABLET | ORAL | 2 refills | Status: DC

## 2022-04-15 NOTE — Progress Notes (Signed)
[facility]  Patient ID: KEIRSTON SAEPHANH,  MRN: [id number]  PCP: Cari Caraway, MD  Date of Evaluation: 04/15/2022 Time spent:30 minutes  Chief Complaint:  Chief Complaint   Anxiety; Depression; Follow-up; Medication Refill; Patient Education; Stress     HISTORY/CURRENT STATUS: HPI 21 year old female presents to this office for follow up and medication management. Says she is doing well right now considering her recent breakup with boyfriend of one year. Says she is working out a lot which has helped her cope. Says that irritability and racing thoughts has diminished.  She would like to continue on her current meds. She reports anxiety today at 3/10 and depression at 3/10. She denies mania currently. No mania, no psychosis. No SI/HI.   No previous medication trials       Individual Medical History/ Review of Systems: Changes? :No   Allergies: Patient has no known allergies.  Current Medications:  Current Outpatient Medications:    busPIRone (BUSPAR) 10 MG tablet, Take 1 tablet (10 mg total) by mouth 2 (two) times daily after a meal., Disp: 180 tablet, Rfl: 2   lamoTRIgine (LAMICTAL) 100 MG tablet, Take 1 tablet (100 mg total) by mouth daily., Disp: 90 tablet, Rfl: 1   norgestimate-ethinyl estradiol (ORTHO-CYCLEN) 0.25-35 MG-MCG tablet, 1 tablet (Patient not taking: Reported on 04/15/2022), Disp: , Rfl:    ondansetron (ZOFRAN-ODT) 4 MG disintegrating tablet, Take 1 tablet (4 mg total) by mouth every 8 (eight) hours as needed for nausea or vomiting. (Patient not taking: Reported on 04/15/2022), Disp: 12 tablet, Rfl: 0   prochlorperazine (COMPAZINE) 25 MG suppository, Place 1 suppository (25 mg total) rectally every 12 (twelve) hours as needed for nausea or vomiting. (Patient not taking: Reported on 04/15/2022), Disp: 12 suppository, Rfl: 0   sertraline (ZOLOFT) 100 MG tablet, TAKE 1.5 TABLETS (150MG  TOTAL) BY MOUTH DAILY, Disp: 135 tablet, Rfl: 2 Medication Side  Effects: none  Family Medical/ Social History: Changes? No  MENTAL HEALTH EXAM:  There were no vitals taken for this visit.There is no height or weight on file to calculate BMI.  General Appearance: Casual and Neat  Eye Contact:  Good  Speech:  Clear and Coherent  Volume:  Normal  Mood:  Anxious, Depressed, and Dysphoric  Affect:  Appropriate  Thought Process:  Coherent  Orientation:  Full (Time, Place, and Person)  Thought Content: Logical   Suicidal Thoughts:  No  Homicidal Thoughts:  No  Memory:  WNL  Judgement:  Good  Insight:  Good  Psychomotor Activity:  Normal  Concentration:  Concentration: Good  Recall:  Good  Fund of Knowledge: Good  Language: Good  Assets:  Desire for Improvement  ADL's:  Intact  Cognition: WNL  Prognosis:  Good    DIAGNOSES:    ICD-10-CM   1. Generalized anxiety disorder  F41.1 busPIRone (BUSPAR) 10 MG tablet    lamoTRIgine (LAMICTAL) 100 MG tablet    sertraline (ZOLOFT) 100 MG tablet    2. Major depressive disorder, recurrent episode, moderate (HCC)  F33.1 lamoTRIgine (LAMICTAL) 100 MG tablet    sertraline (ZOLOFT) 100 MG tablet      Receiving Psychotherapy: No    RECOMMENDATIONS:   Continue Zoloft to 150 mg daily Continue Lamictal to 100 mg daily Continue Buspar 10 mg twice daily.  Will report any side effects or worsening symptoms promptly. To Follow up in 6  months to reassess. Greater than 50% of 20 min face to face time with patient was spent on counseling  and coordination of care. No changes this visit. Pt would like to continue with her current meds with no changes.  We discussed her recent break up with boyfriend of 1 year. She does not think she needs counseling right now.  Counseled patient regarding potential benefits, risks, and side effects of Lamictal to include potential risk of Stevens-Johnson syndrome. Advised patient to stop taking Lamictal and contact office immediately if rash develops and to seek urgent medical  attention if rash is severe and/or spreading quickly.     Joan Flores, NP

## 2022-10-16 ENCOUNTER — Encounter: Payer: Self-pay | Admitting: Behavioral Health

## 2022-10-16 ENCOUNTER — Ambulatory Visit (INDEPENDENT_AMBULATORY_CARE_PROVIDER_SITE_OTHER): Payer: BC Managed Care – PPO | Admitting: Behavioral Health

## 2022-10-16 DIAGNOSIS — F3341 Major depressive disorder, recurrent, in partial remission: Secondary | ICD-10-CM | POA: Diagnosis not present

## 2022-10-16 DIAGNOSIS — F411 Generalized anxiety disorder: Secondary | ICD-10-CM

## 2022-10-16 NOTE — Progress Notes (Signed)
Leslie Gonzalez [id number] [birth year] 22 y.o.  Virtual Visit via Telephone Note  I connected with pt on 10/16/22 at  8:00 AM EDT by telephone and verified that I am speaking with the correct person using two identifiers.   I discussed the limitations, risks, security and privacy concerns of performing an evaluation and management service by telephone and the availability of in person appointments. I also discussed with the patient that there may be a patient responsible charge related to this service. The patient expressed understanding and agreed to proceed.   I discussed the assessment and treatment plan with the patient. The patient was provided an opportunity to ask questions and all were answered. The patient agreed with the plan and demonstrated an understanding of the instructions.   The patient was advised to call back or seek an in-person evaluation if the symptoms worsen or if the condition fails to improve as anticipated.  I provided 20 minutes of non-face-to-face time during this encounter.  The patient was located at home.  The provider was located at [facility].   Joan Flores, NP   Subjective:   Patient ID:  Leslie Gonzalez is a 22 y.o. (DOB [birth year]) female.  Chief Complaint:  Chief Complaint  Patient presents with   Anxiety   Depression   Follow-up   Medication Problem   Patient Education    HPI 22 year old female presents to this office via telephonic interview for follow up and medication management. Says that she is doing really well right now. Finishing up junior year in college. She does not want to change or adjust medications. Says that irritability and racing thoughts has diminished.  She reports anxiety today at 2/10 and depression at 2/10. She denies mania currently. No mania, no psychosis. No SI/HI.   No previous medication trials   Review of Systems:  Review of Systems  Constitutional: Negative.   Allergic/Immunologic: Negative.    Neurological: Negative.   Psychiatric/Behavioral:  Positive for dysphoric mood.     Medications: I have reviewed the patient's current medications.  Current Outpatient Medications  Medication Sig Dispense Refill   busPIRone (BUSPAR) 10 MG tablet Take 1 tablet (10 mg total) by mouth 2 (two) times daily after a meal. 180 tablet 2   lamoTRIgine (LAMICTAL) 100 MG tablet Take 1 tablet (100 mg total) by mouth daily. 90 tablet 1   norgestimate-ethinyl estradiol (ORTHO-CYCLEN) 0.25-35 MG-MCG tablet 1 tablet (Patient not taking: Reported on 04/15/2022)     ondansetron (ZOFRAN-ODT) 4 MG disintegrating tablet Take 1 tablet (4 mg total) by mouth every 8 (eight) hours as needed for nausea or vomiting. (Patient not taking: Reported on 04/15/2022) 12 tablet 0   prochlorperazine (COMPAZINE) 25 MG suppository Place 1 suppository (25 mg total) rectally every 12 (twelve) hours as needed for nausea or vomiting. (Patient not taking: Reported on 04/15/2022) 12 suppository 0   sertraline (ZOLOFT) 100 MG tablet TAKE 1.5 TABLETS (150MG  TOTAL) BY MOUTH DAILY 135 tablet 2   No current facility-administered medications for this visit.    Medication Side Effects: None  Allergies: No Known Allergies  Past Medical History:  Diagnosis Date   Anxiety    Depression     Family History  Problem Relation Age of Onset   Headache Father        Cluster Headaches   Migraines Maternal Grandmother    Depression Paternal Grandfather     Social History   Socioeconomic History   Marital status: Single  Spouse name: Not on file   Number of children: Not on file   Years of education: 13   Highest education level: Not on file  Occupational History   Occupation: receptionist    Comment:  [employer]  Tobacco Use   Smoking status: Never   Smokeless tobacco: Never  Vaping Use   Vaping Use: Never used  Substance and Sexual Activity   Alcohol use: Never   Drug use: Never   Sexual activity: Yes    Birth  control/protection: Implant  Other Topics Concern   Not on file  Social History Narrative   Lives with mom and dad. Has two sisters [id number] and [id number]. Attends [employer]. Like to work out, like to run. 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 Enjoys traveling.    Social Determinants of Health   Financial Resource Strain: Not on file  Food Insecurity: Not on file  Transportation Needs: Not on file  Physical Activity: Not on file  Stress: Not on file  Social Connections: Not on file  Intimate Partner Violence: Not on file    Past Medical History, Surgical history, Social history, and Family history were reviewed and updated as appropriate.   Please see review of systems for further details on the patient's review from today.   Objective:   Physical Exam:  There were no vitals taken for this visit.  Physical Exam Neurological:     Mental Status: She is alert and oriented to person, place, and time.  Psychiatric:        Attention and Perception: Attention and perception normal.        Mood and Affect: Mood normal.        Speech: Speech normal.        Behavior: Behavior normal. Behavior is cooperative.        Cognition and Memory: Cognition and memory normal.        Judgment: Judgment normal.     Comments: Insight intact     Lab Review:     Component Value Date/Time   NA 138 09/02/2021 0750   K 4.4 09/02/2021 0750   CL 108 09/02/2021 0750   CO2 20 (L) 09/02/2021 0750   GLUCOSE 95 09/02/2021 0750   BUN 12 09/02/2021 0750   CREATININE 0.77 09/02/2021 0750   CALCIUM 9.3 09/02/2021 0750   PROT 6.9 09/02/2021 0750   ALBUMIN 3.8 09/02/2021 0750   AST 22 09/02/2021 0750   ALT 12 09/02/2021 0750   ALKPHOS 72 09/02/2021 0750   BILITOT 1.1 09/02/2021 0750   GFRNONAA >60 09/02/2021 0750       Component Value Date/Time   WBC 16.1 (H) 09/02/2021 0750   RBC 5.07 09/02/2021 0750   HGB 14.5 09/02/2021 0750   HCT 44.0 09/02/2021 0750   PLT 262 09/02/2021 0750   MCV 86.8  09/02/2021 0750   MCH 28.6 09/02/2021 0750   MCHC 33.0 09/02/2021 0750   RDW 12.0 09/02/2021 0750   LYMPHSABS 1.0 09/02/2021 0750   MONOABS 1.3 (H) 09/02/2021 0750   EOSABS 0.3 09/02/2021 0750   BASOSABS 0.0 09/02/2021 0750    No results found for: "POCLITH", "LITHIUM"   No results found for: "PHENYTOIN", "PHENOBARB", "VALPROATE", "CBMZ"   .res Assessment: Plan:   reater than 50% of 20 min telephonic interview time with patient was spent on counseling and coordination of care. No changes this visit. Pt would like to continue with her current meds with no changes.    Counseled patient regarding potential benefits,  risks, and side effects of Lamictal to include potential risk of Stevens-Johnson syndrome. Advised patient to stop taking Lamictal and contact office immediately if rash develops and to seek urgent medical attention if rash is severe and/or spreading quickly.   Continue Zoloft to 150 mg daily Continue Lamictal to 100 mg daily Continue Buspar 10 mg twice daily.  Will report any side effects or worsening symptoms promptly. To Follow up in 6  months to reassess. Reviewed PDMP  Watt Climes. Tres Grzywacz, NP    There are no diagnoses linked to this encounter.  Please see After Visit Summary for patient specific instructions.  No future appointments.  No orders of the defined types were placed in this encounter.     -------------------------------

## 2022-12-09 IMAGING — US US PELVIS COMPLETE WITH TRANSVAGINAL
1 series · 13 of 25 positions shown · non-contrast
Comparison: None

CLINICAL DATA: Right lower quadrant pain radiating across the
pelvis x4 days.

EXAM:
TRANSABDOMINAL AND TRANSVAGINAL ULTRASOUND OF PELVIS
TECHNIQUE: Both transabdominal and transvaginal ultrasound examinations of the
pelvis were performed. Transabdominal technique was performed for
global imaging of the pelvis including uterus, ovaries, adnexal
regions, and pelvic cul-de-sac. It was necessary to proceed with
endovaginal exam following the transabdominal exam to visualize the
uterus, endometrium, bilateral ovaries and bilateral adnexa.

[Series 1: us pelvic complete with transvaginal · 87 acquisitions, 13 frames shown]
[im 1/87]
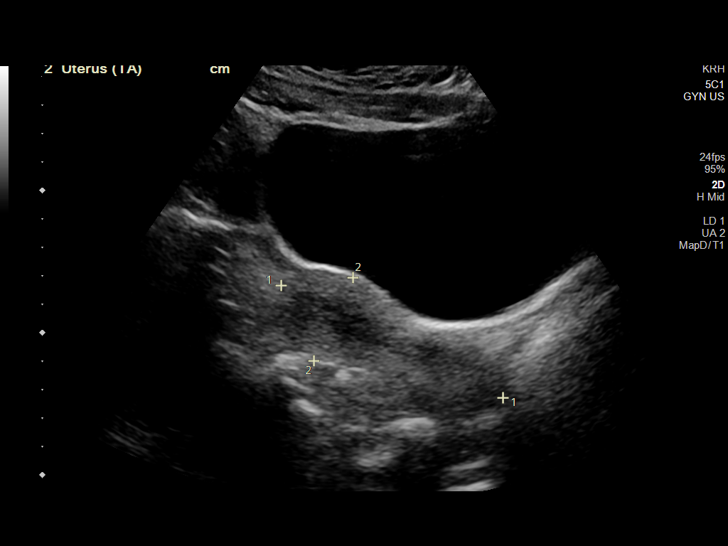
[im 8/87]
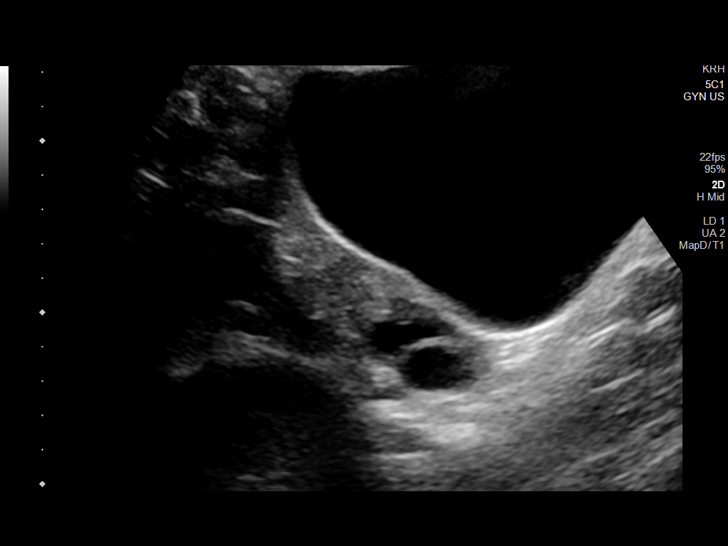
[im 15/87]
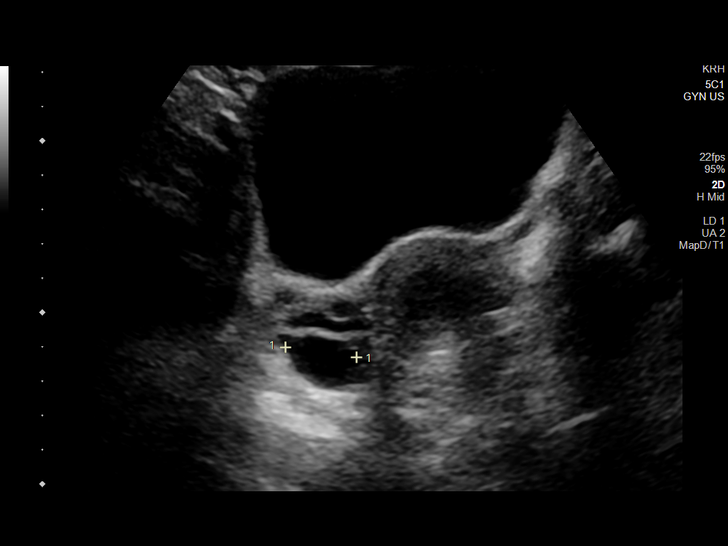
[im 22/87]
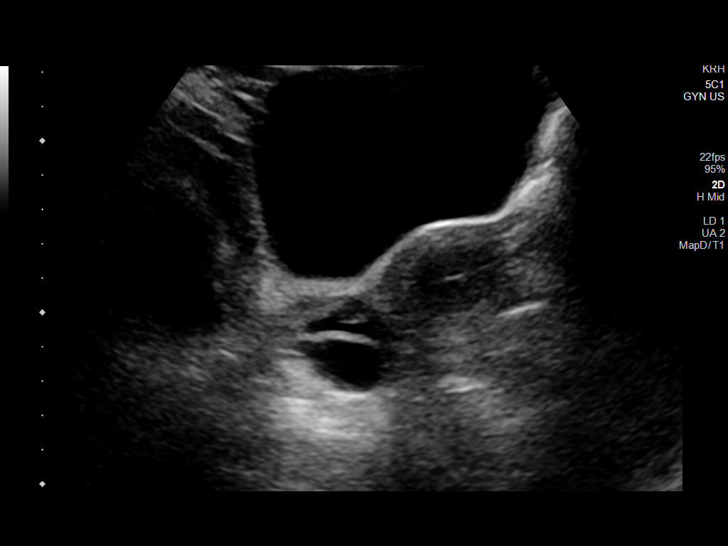
[im 29/87]
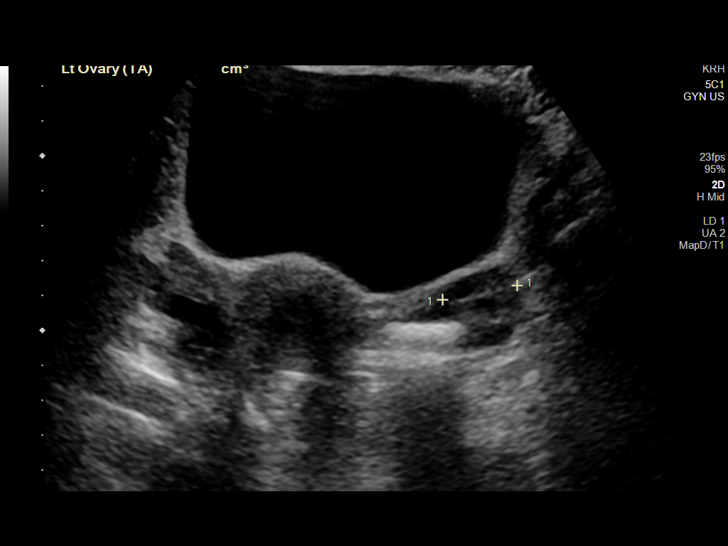
[im 36/87]
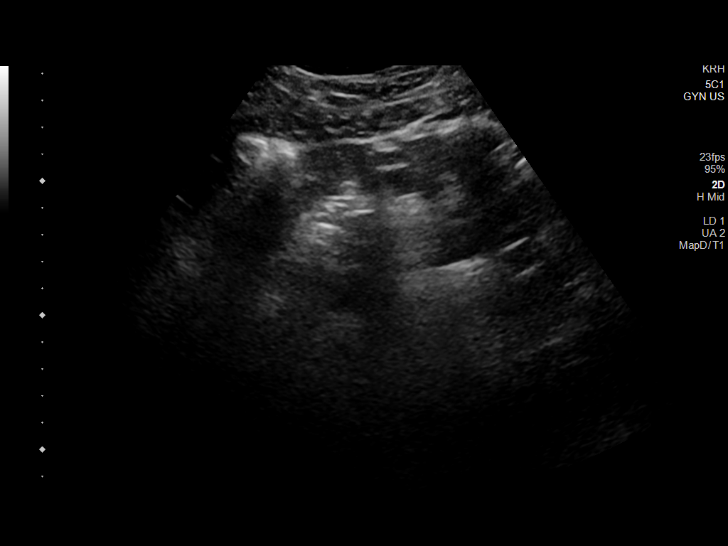
[im 44/87]
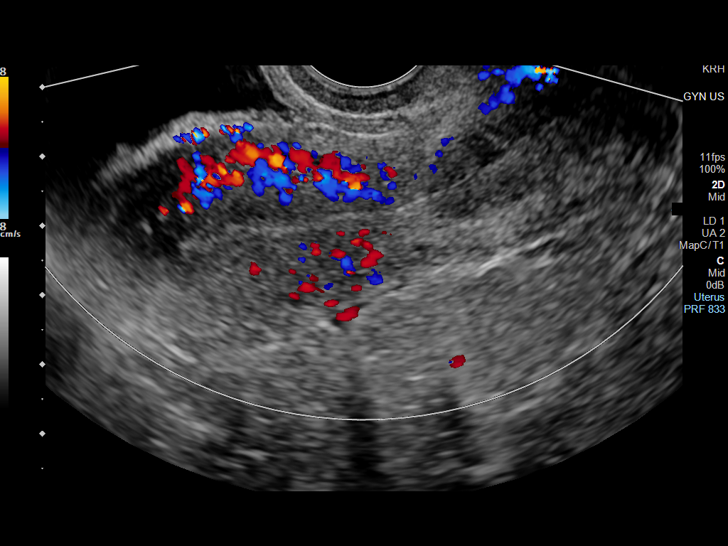
[im 51/87]
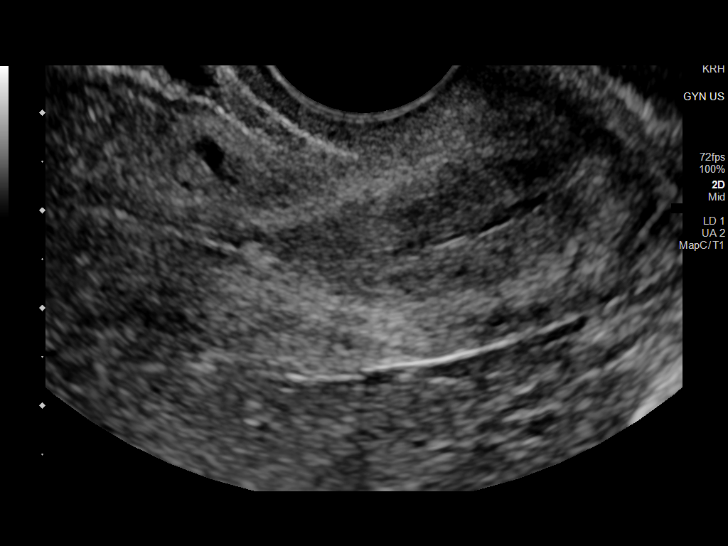
[im 58/87]
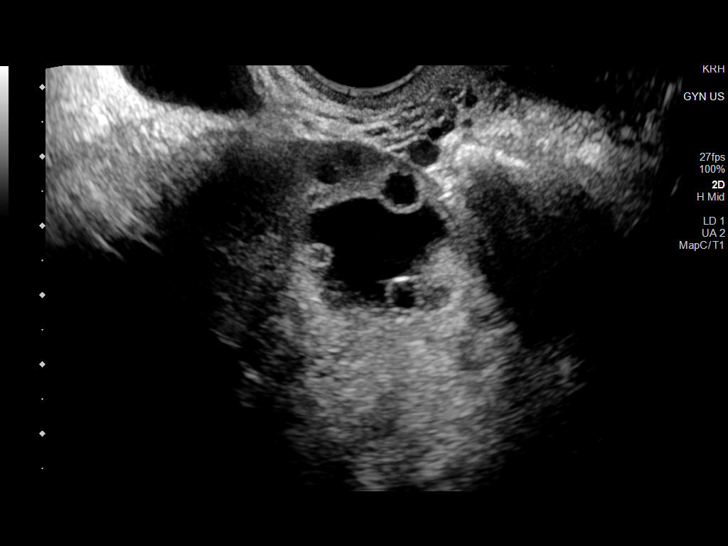
[im 65/87]
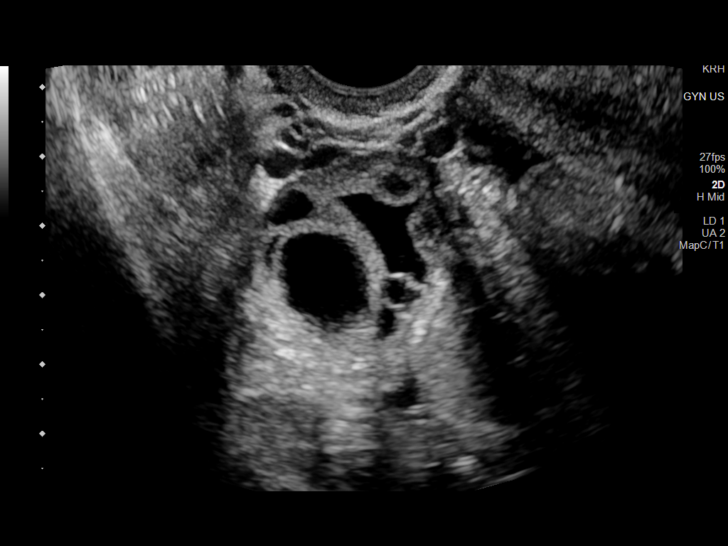
[im 72/87]
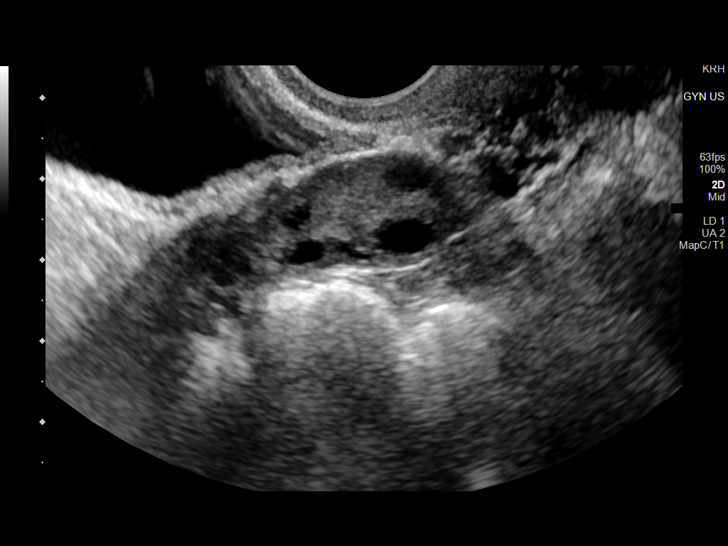
[im 79/87]
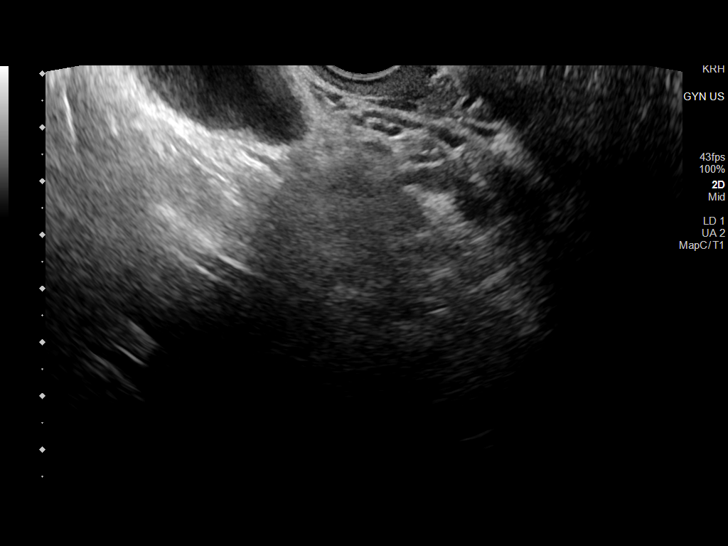
[im 87/87]
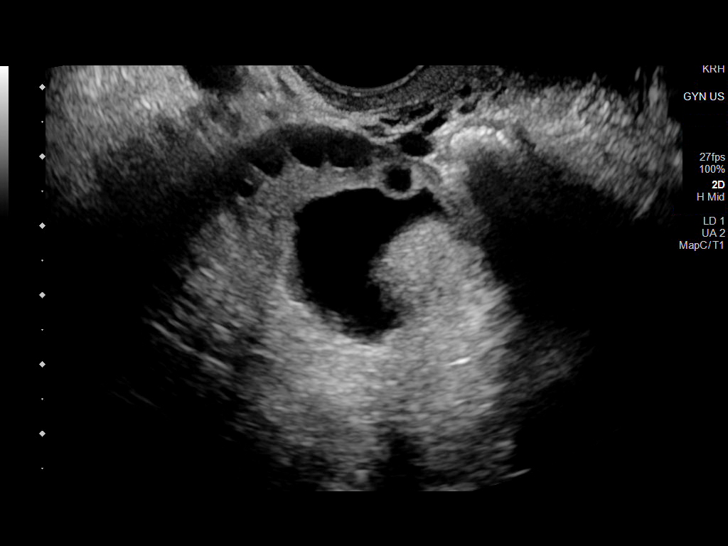

[13 of 25 positions shown; findings below may reference images not displayed]

FINDINGS: Uterus

Measurements: 8.0 cm x 3.1 cm x 4.3 cm = volume: 55.2 mL. No
fibroids or other mass visualized. A small amount of fluid is seen
within the cervical canal.

Endometrium

Thickness: 3 mm.  No focal abnormality visualized.

Right ovary

Measurements: 3.9 cm x 3.0 cm x 2.9 cm = volume: 17.6 ML. 2.1 cm x
1.7 cm x 1.8 cm and 2.4 cm x 2.3 cm x 1.8 cm anechoic structures are
seen within the right ovary. No abnormal flow is seen within these
regions on color Doppler evaluation.

Left ovary

Measurements: 3.1 cm x 1.7 cm x 2.1 cm = volume: 5.6 mL. Normal
appearance/no adnexal mass.

Other findings

No abnormal free fluid.
IMPRESSION: 1. Small amount of fluid within the cervical canal.
2. Small right ovarian cysts.

## 2022-12-11 IMAGING — CT CT ABD-PELV W/ CM
2 of 4 series · 16 of 46 positions shown, 18 images · IV contrast (agent unspecified)
Comparison: No priors.

CLINICAL DATA: 20-year-old female with history of right lower
quadrant abdominal pain for 1 week. Nausea and vomiting.

EXAM:
CT ABDOMEN AND PELVIS WITH CONTRAST
TECHNIQUE: Multidetector CT imaging of the abdomen and pelvis was performed
using the standard protocol following bolus administration of
intravenous contrast.

[Series 3: abd/ pelvis 5.0 i30f 2 · axial · 0.86mm/px · z∈[-630,-170]mm · 13 of 102 slices shown, 15 images]
[im 5/102  soft-tissue]
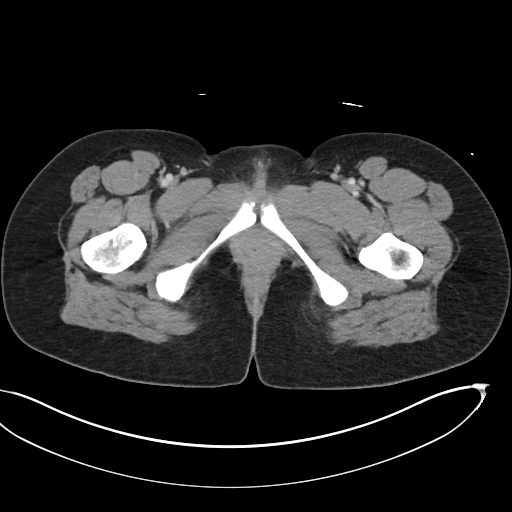
[im 5/102  bone]
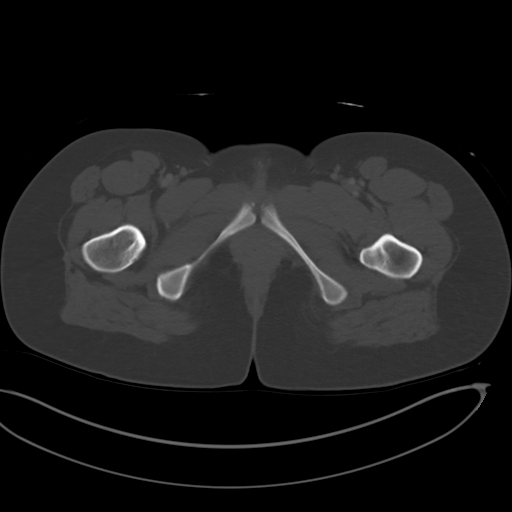
[im 14/102  soft-tissue]
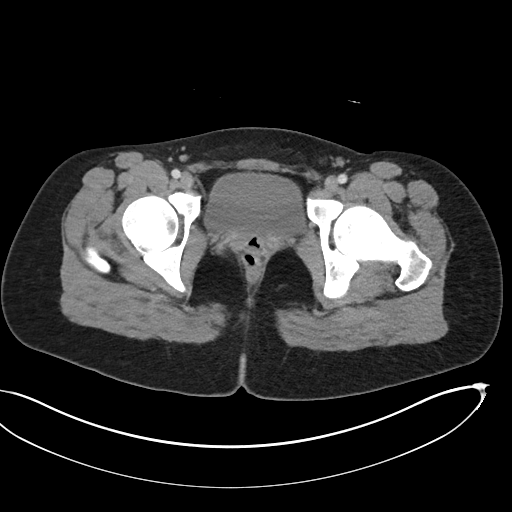
[im 22/102  soft-tissue]
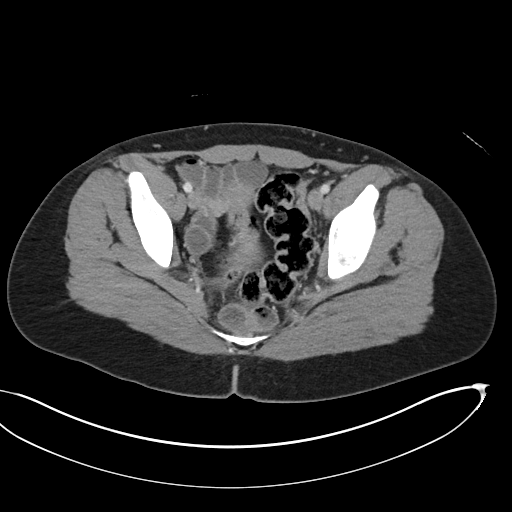
[im 27/102  soft-tissue]
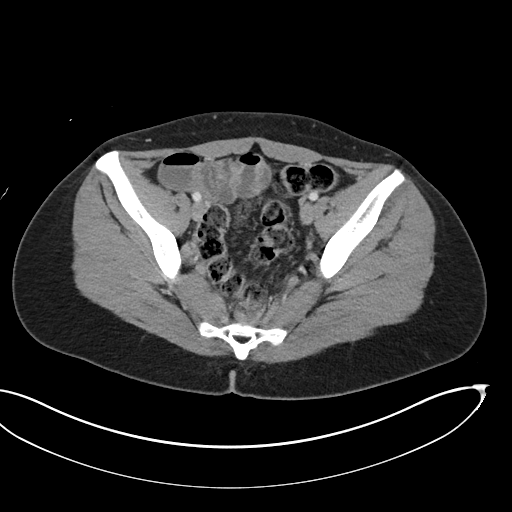
[im 36/102  soft-tissue]
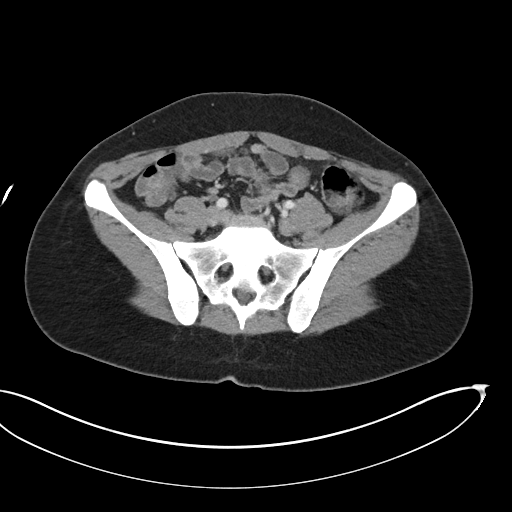
[im 44/102  soft-tissue]
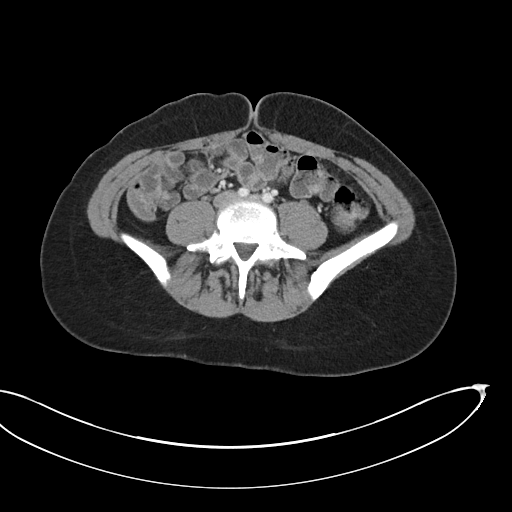
[im 53/102  soft-tissue]
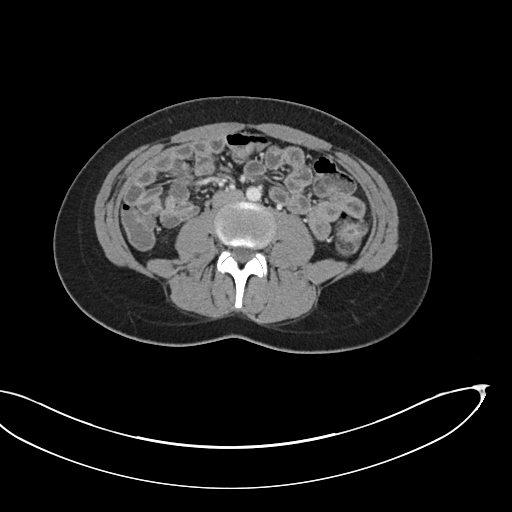
[im 58/102  soft-tissue]
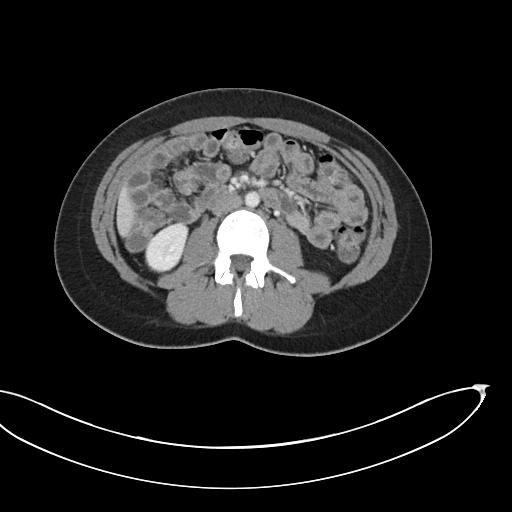
[im 66/102  soft-tissue]
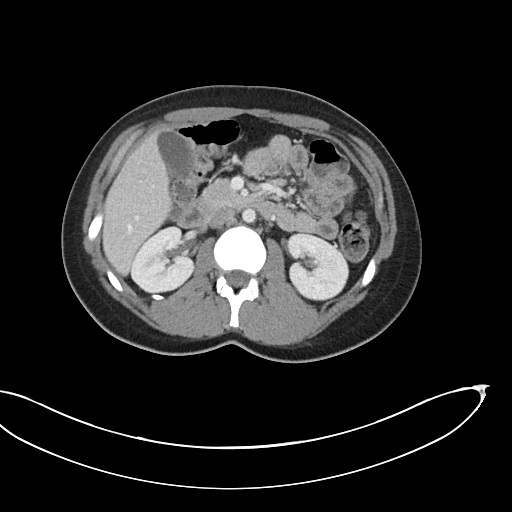
[im 66/102  bone]
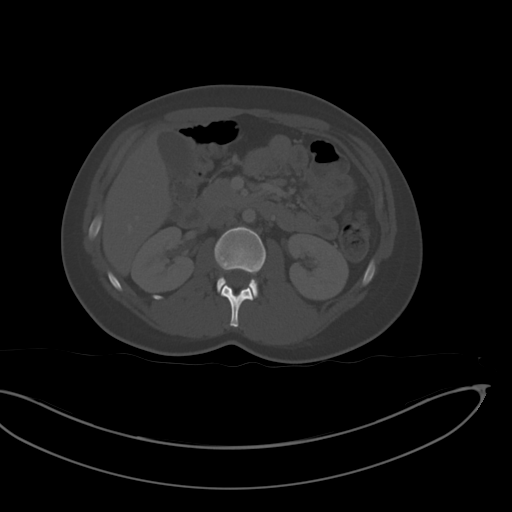
[im 75/102  soft-tissue]
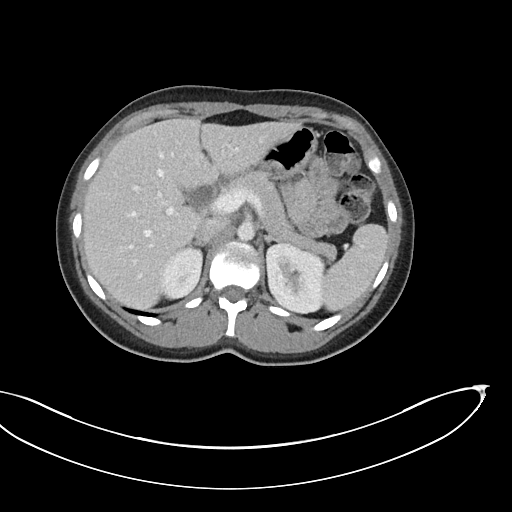
[im 80/102  soft-tissue]
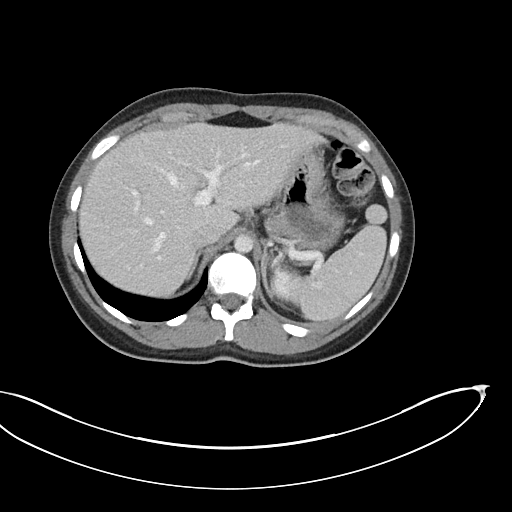
[im 88/102  soft-tissue]
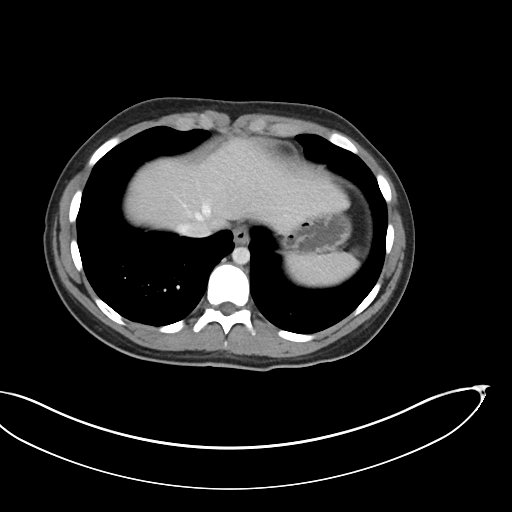
[im 97/102  soft-tissue]
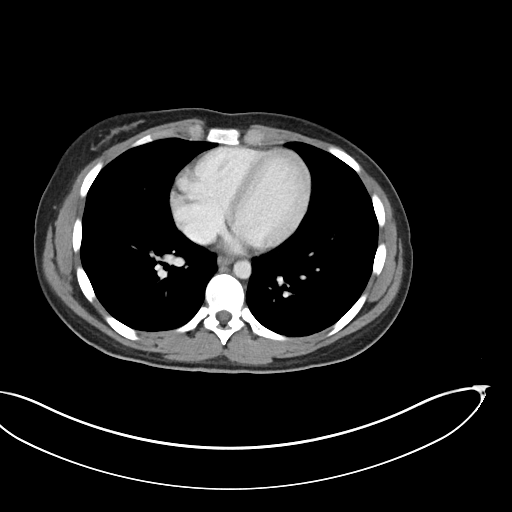

[Series 6: coronal soft tissue · coronal · 0.83mm/px · 3 of 86 slices shown]
[im 29/86  soft-tissue]
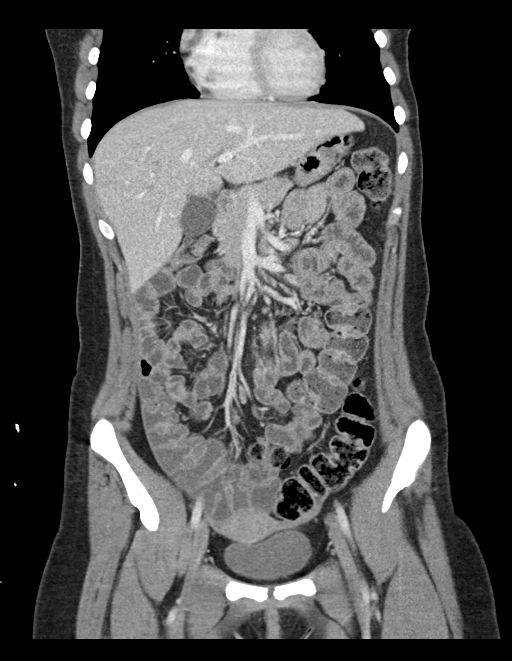
[im 38/86  soft-tissue]
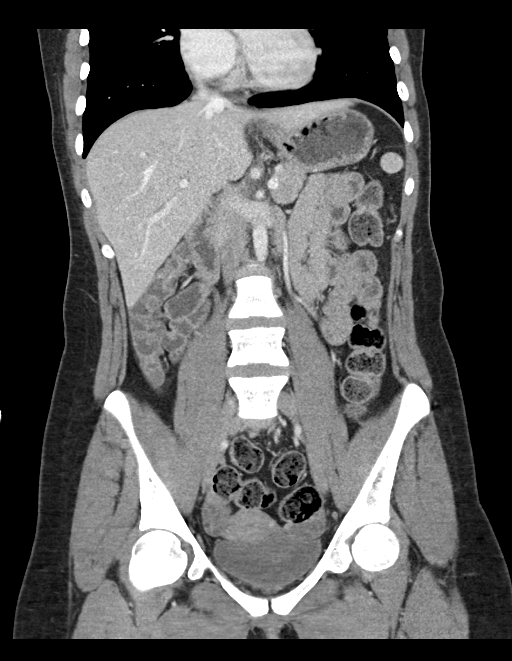
[im 48/86  soft-tissue]
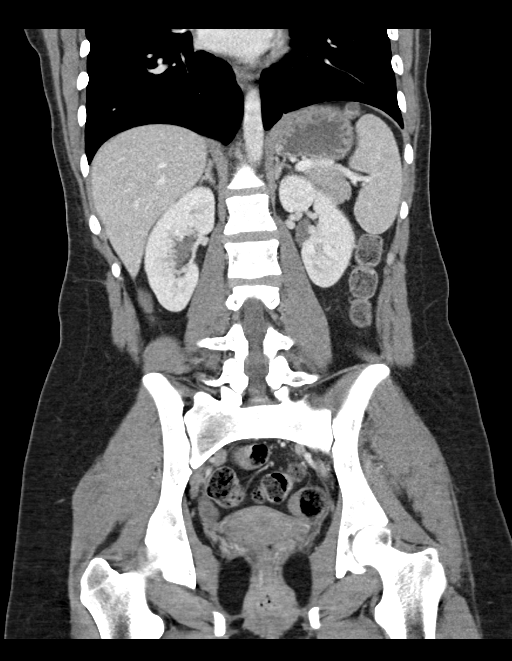

[16 of 46 positions shown; findings below may reference images not displayed]

RADIATION DOSE REDUCTION: This exam was performed according to the
departmental dose-optimization program which includes automated
exposure control, adjustment of the mA and/or kV according to
patient size and/or use of iterative reconstruction technique.

CONTRAST:  100mL OMNIPAQUE IOHEXOL 300 MG/ML  SOLN
FINDINGS: Lower chest: Unremarkable.

Hepatobiliary: No suspicious cystic or solid hepatic lesions. No
intra or extrahepatic biliary ductal dilatation. Gallbladder is
normal in appearance.

Pancreas: No pancreatic mass. No pancreatic ductal dilatation. No
pancreatic or peripancreatic fluid collections or inflammatory
changes.

Spleen: Unremarkable.

Adrenals/Urinary Tract: Bilateral kidneys and bilateral adrenal
glands are normal in appearance. No hydroureteronephrosis. Urinary
bladder is normal in appearance.

Stomach/Bowel: Unenhanced appearance of the stomach is normal. No
pathologic dilatation of small bowel or colon. Normal appendix.

Vascular/Lymphatic: No significant atherosclerotic disease, aneurysm
or dissection noted in the abdominal or pelvic vasculature. No
lymphadenopathy noted in the abdomen or pelvis.

Reproductive: Uterus and ovaries are unremarkable in appearance.

Other: No significant volume of ascites.  No pneumoperitoneum.

Musculoskeletal: There are no aggressive appearing lytic or blastic
lesions noted in the visualized portions of the skeleton.
IMPRESSION: 1. No acute findings are noted in the abdomen or pelvis to account
for the patient's symptoms. Specifically, the appendix is normal.

## 2022-12-18 ENCOUNTER — Other Ambulatory Visit: Payer: Self-pay | Admitting: Behavioral Health

## 2022-12-18 DIAGNOSIS — F411 Generalized anxiety disorder: Secondary | ICD-10-CM

## 2022-12-18 DIAGNOSIS — F331 Major depressive disorder, recurrent, moderate: Secondary | ICD-10-CM

## 2023-02-18 ENCOUNTER — Telehealth: Payer: Self-pay | Admitting: Behavioral Health

## 2023-02-18 DIAGNOSIS — F331 Major depressive disorder, recurrent, moderate: Secondary | ICD-10-CM

## 2023-02-18 DIAGNOSIS — F411 Generalized anxiety disorder: Secondary | ICD-10-CM

## 2023-02-18 MED ORDER — LAMOTRIGINE 100 MG PO TABS: 100.0000 mg | ORAL_TABLET | Freq: Every day | ORAL | 0 refills | Status: DC

## 2023-02-18 NOTE — Telephone Encounter (Signed)
Sent!

## 2023-02-18 NOTE — Telephone Encounter (Signed)
Pt called and said that she left her lamictal at home and she is out of town. She needs [facility] tablets [facility] [address] in [place], [place]

## 2023-03-18 ENCOUNTER — Other Ambulatory Visit: Payer: Self-pay | Admitting: Behavioral Health

## 2023-03-18 DIAGNOSIS — F331 Major depressive disorder, recurrent, moderate: Secondary | ICD-10-CM

## 2023-03-18 DIAGNOSIS — F411 Generalized anxiety disorder: Secondary | ICD-10-CM

## 2023-03-20 ENCOUNTER — Other Ambulatory Visit: Payer: Self-pay | Admitting: Behavioral Health

## 2023-03-20 DIAGNOSIS — F411 Generalized anxiety disorder: Secondary | ICD-10-CM

## 2023-03-20 DIAGNOSIS — F331 Major depressive disorder, recurrent, moderate: Secondary | ICD-10-CM

## 2023-07-03 ENCOUNTER — Other Ambulatory Visit: Payer: Self-pay | Admitting: Behavioral Health

## 2023-07-03 DIAGNOSIS — F331 Major depressive disorder, recurrent, moderate: Secondary | ICD-10-CM

## 2023-07-03 DIAGNOSIS — F411 Generalized anxiety disorder: Secondary | ICD-10-CM

## 2023-07-04 NOTE — Telephone Encounter (Signed)
Please call to schedule FU. Was due in Oct.

## 2023-07-29 ENCOUNTER — Other Ambulatory Visit: Payer: Self-pay | Admitting: Behavioral Health

## 2023-07-29 DIAGNOSIS — F411 Generalized anxiety disorder: Secondary | ICD-10-CM

## 2023-07-29 DIAGNOSIS — F331 Major depressive disorder, recurrent, moderate: Secondary | ICD-10-CM

## 2023-07-30 NOTE — Telephone Encounter (Signed)
Please call to schedule FU. Last seen in April and RTC in 6 mo.

## 2023-07-30 NOTE — Telephone Encounter (Signed)
Pt is scheduled for 08/22/23

## 2023-08-22 ENCOUNTER — Encounter: Payer: Self-pay | Admitting: Behavioral Health

## 2023-08-22 ENCOUNTER — Ambulatory Visit: Payer: 59 | Admitting: Behavioral Health

## 2023-08-22 DIAGNOSIS — F411 Generalized anxiety disorder: Secondary | ICD-10-CM | POA: Diagnosis not present

## 2023-08-22 DIAGNOSIS — F331 Major depressive disorder, recurrent, moderate: Secondary | ICD-10-CM | POA: Diagnosis not present

## 2023-08-22 MED ORDER — LAMOTRIGINE 100 MG PO TABS: 100.0000 mg | ORAL_TABLET | Freq: Every day | ORAL | 1 refills | Status: DC

## 2023-08-22 MED ORDER — SERTRALINE HCL 100 MG PO TABS: ORAL_TABLET | ORAL | 2 refills | Status: DC

## 2023-08-22 MED ORDER — BUSPIRONE HCL 10 MG PO TABS: 10.0000 mg | ORAL_TABLET | Freq: Two times a day (BID) | ORAL | 2 refills | Status: DC

## 2023-08-22 NOTE — Progress Notes (Signed)
[facility]  Patient ID: Leslie Gonzalez,  MRN: [id number]  PCP: Gweneth Dimitri, MD  Date of Evaluation: 08/22/2023 Time spent:30 minutes  Chief Complaint:  Chief Complaint   Anxiety; Depression; Follow-up; Medication Refill; Patient Education     HISTORY/CURRENT STATUS: HPI  23 year old female presents to this office for follow up and medication management. Says that she is doing really well right now. Finishing up junior year in college. She does not want to change or adjust medications. Says that irritability and racing thoughts has diminished. Has boyfriend now of 2.5 months. Things are going well.  She reports anxiety today at 2/10 and depression at 2/10. She denies mania currently. No mania, no psychosis. No SI/HI.   No previous medication trials    Individual Medical History/ Review of Systems: Changes? :No   Allergies: Patient has no known allergies.  Current Medications:  Current Outpatient Medications:    busPIRone (BUSPAR) 10 MG tablet, Take 1 tablet (10 mg total) by mouth 2 (two) times daily after a meal., Disp: 180 tablet, Rfl: 2   lamoTRIgine (LAMICTAL) 100 MG tablet, Take 1 tablet (100 mg total) by mouth daily., Disp: 90 tablet, Rfl: 1   norgestimate-ethinyl estradiol (ORTHO-CYCLEN) 0.25-35 MG-MCG tablet, 1 tablet (Patient not taking: Reported on 04/15/2022), Disp: , Rfl:    ondansetron (ZOFRAN-ODT) 4 MG disintegrating tablet, Take 1 tablet (4 mg total) by mouth every 8 (eight) hours as needed for nausea or vomiting. (Patient not taking: Reported on 04/15/2022), Disp: 12 tablet, Rfl: 0   prochlorperazine (COMPAZINE) 25 MG suppository, Place 1 suppository (25 mg total) rectally every 12 (twelve) hours as needed for nausea or vomiting. (Patient not taking: Reported on 04/15/2022), Disp: 12 suppository, Rfl: 0   sertraline (ZOLOFT) 100 MG tablet, TAKE 1.5 TABLETS (150 MG TOTAL) BY MOUTH DAILY, Disp: 135 tablet, Rfl: 2 Medication Side Effects: none  Family  Medical/ Social History: Changes? No  MENTAL HEALTH EXAM:  Weight 165 lb (74.8 kg), last menstrual period 08/09/2023.Body mass index is 23.68 kg/m.  General Appearance: Casual, Neat, and Well Groomed  Eye Contact:  Good  Speech:  Clear and Coherent  Volume:  Normal  Mood:  NA  Affect:  Appropriate  Thought Process:  Coherent  Orientation:  Full (Time, Place, and Person)  Thought Content: Logical   Suicidal Thoughts:  No  Homicidal Thoughts:  No  Memory:  WNL  Judgement:  Good  Insight:  Good  Psychomotor Activity:  Normal  Concentration:  Concentration: Good  Recall:  Good  Fund of Knowledge: Good  Language: Good  Assets:  Desire for Improvement  ADL's:  Intact  Cognition: WNL  Prognosis:  Good    DIAGNOSES:    ICD-10-CM   1. Generalized anxiety disorder  F41.1 busPIRone (BUSPAR) 10 MG tablet    lamoTRIgine (LAMICTAL) 100 MG tablet    sertraline (ZOLOFT) 100 MG tablet    2. Major depressive disorder, recurrent episode, moderate (HCC)  F33.1 lamoTRIgine (LAMICTAL) 100 MG tablet    sertraline (ZOLOFT) 100 MG tablet      Receiving Psychotherapy: No    RECOMMENDATIONS:  Greater than 50% of 30 min face to face with patient was spent on counseling and coordination of care. No changes this visit. Pt would like to continue with her current meds with no changes.    Counseled patient regarding potential benefits, risks, and side effects of Lamictal to include potential risk of Stevens-Johnson syndrome. Advised patient to stop taking Lamictal and contact  office immediately if rash develops and to seek urgent medical attention if rash is severe and/or spreading quickly.    Continue Zoloft to 150 mg daily Continue Lamictal to 100 mg daily Continue Buspar 10 mg twice daily.  Will report any side effects or worsening symptoms promptly. To Follow up in 6  months to reassess. Reviewed PDMP   Watt Climes. Brodric Schauer, NP

## 2024-01-23 ENCOUNTER — Encounter: Payer: Self-pay | Admitting: Advanced Practice Midwife

## 2024-02-19 ENCOUNTER — Ambulatory Visit: Payer: 59 | Admitting: Behavioral Health

## 2024-02-28 ENCOUNTER — Other Ambulatory Visit: Payer: Self-pay | Admitting: Behavioral Health

## 2024-02-28 DIAGNOSIS — F411 Generalized anxiety disorder: Secondary | ICD-10-CM

## 2024-02-28 DIAGNOSIS — F331 Major depressive disorder, recurrent, moderate: Secondary | ICD-10-CM

## 2024-03-03 ENCOUNTER — Ambulatory Visit: Admitting: Behavioral Health

## 2024-03-29 ENCOUNTER — Encounter: Payer: Self-pay | Admitting: Behavioral Health

## 2024-03-29 ENCOUNTER — Ambulatory Visit: Admitting: Behavioral Health

## 2024-03-29 DIAGNOSIS — F411 Generalized anxiety disorder: Secondary | ICD-10-CM

## 2024-03-29 DIAGNOSIS — F331 Major depressive disorder, recurrent, moderate: Secondary | ICD-10-CM | POA: Diagnosis not present

## 2024-03-29 MED ORDER — BUSPIRONE HCL 10 MG PO TABS: 10.0000 mg | ORAL_TABLET | Freq: Two times a day (BID) | ORAL | 2 refills | Status: DC

## 2024-03-29 MED ORDER — SERTRALINE HCL 100 MG PO TABS: ORAL_TABLET | ORAL | 2 refills | Status: DC

## 2024-03-29 MED ORDER — LAMOTRIGINE 100 MG PO TABS: 100.0000 mg | ORAL_TABLET | Freq: Every day | ORAL | 1 refills | Status: DC

## 2024-03-29 NOTE — Progress Notes (Signed)
 [facility]  Patient ID: Leslie Gonzalez,  MRN: [id number]  PCP: Aisha Harvey, MD  Date of Evaluation: 03/29/2024 Time spent:30 minutes  Chief Complaint:  Chief Complaint   Depression; Anxiety; Follow-up; Medication Refill; Patient Education     HISTORY/CURRENT STATUS: HPI  23 year old female presents to this office for follow up and medication management. Says that she is doing really well right now. Feels that medications are doing well. She is concerned about possible dx of borderline. She says that she is aware of her behavior with interpersonal relationships and is working to improve. Has mood fluctuation periodically but manageable.   She and her boyfriend are talking about taking their relationship to the next level, and possibly engagement.  Considering a masters program out of state, possibly [place]  or [place].  She reports anxiety today at 2/10 and depression at 2/10. She denies mania currently. No mania, no psychosis. No SI/HI.   No previous medication trials         Individual Medical History/ Review of Systems: Changes? :No   Allergies: Patient has no known allergies.  Current Medications:  Current Outpatient Medications:    busPIRone  (BUSPAR ) 10 MG tablet, Take 1 tablet (10 mg total) by mouth 2 (two) times daily after a meal., Disp: 180 tablet, Rfl: 2   lamoTRIgine  (LAMICTAL ) 100 MG tablet, Take 1 tablet (100 mg total) by mouth daily., Disp: 90 tablet, Rfl: 1   norgestimate-ethinyl estradiol (ORTHO-CYCLEN) 0.25-35 MG-MCG tablet, 1 tablet (Patient not taking: Reported on 04/15/2022), Disp: , Rfl:    ondansetron  (ZOFRAN -ODT) 4 MG disintegrating tablet, Take 1 tablet (4 mg total) by mouth every 8 (eight) hours as needed for nausea or vomiting. (Patient not taking: Reported on 04/15/2022), Disp: 12 tablet, Rfl: 0   prochlorperazine  (COMPAZINE ) 25 MG suppository, Place 1 suppository (25 mg total) rectally every 12 (twelve) hours as needed for nausea or  vomiting. (Patient not taking: Reported on 04/15/2022), Disp: 12 suppository, Rfl: 0   sertraline  (ZOLOFT ) 100 MG tablet, TAKE 1.5 TABLETS (150 MG TOTAL) BY MOUTH DAILY, Disp: 135 tablet, Rfl: 2 Medication Side Effects: none  Family Medical/ Social History: Changes? No  MENTAL HEALTH EXAM:  There were no vitals taken for this visit.There is no height or weight on file to calculate BMI.  General Appearance: Casual, Neat, and Well Groomed  Eye Contact:  Good  Speech:  Clear and Coherent  Volume:  Normal  Mood:  NA  Affect:  Appropriate  Thought Process:  Coherent  Orientation:  Full (Time, Place, and Person)  Thought Content: Logical   Suicidal Thoughts:  No  Homicidal Thoughts:  No  Memory:  WNL  Judgement:  Good  Insight:  Good  Psychomotor Activity:  Normal  Concentration:  Concentration: Good  Recall:  Good  Fund of Knowledge: Good  Language: Good  Assets:  Desire for Improvement  ADL's:  Intact  Cognition: WNL  Prognosis:  Good    DIAGNOSES:    ICD-10-CM   1. Generalized anxiety disorder  F41.1 lamoTRIgine  (LAMICTAL ) 100 MG tablet    busPIRone  (BUSPAR ) 10 MG tablet    sertraline  (ZOLOFT ) 100 MG tablet    2. Major depressive disorder, recurrent episode, moderate (HCC)  F33.1 lamoTRIgine  (LAMICTAL ) 100 MG tablet    sertraline  (ZOLOFT ) 100 MG tablet      Receiving Psychotherapy: No    RECOMMENDATIONS:  Greater than 50% of 30 min face to face with patient was spent on counseling and coordination of care. No  changes this visit. Pt would like to continue with her current meds with no changes.  We discussed her concerns about BPD and modifiable behaviors. She will be seeking new therapist.    Counseled patient regarding potential benefits, risks, and side effects of Lamictal  to include potential risk of Stevens-Johnson syndrome. Advised patient to stop taking Lamictal  and contact office immediately if rash develops and to seek urgent medical attention if rash is severe  and/or spreading quickly.    Continue Zoloft  to 150 mg daily Continue Lamictal  to 100 mg daily Continue Buspar  10 mg twice daily.  Will report any side effects or worsening symptoms promptly. To Follow up in 4 months to reassess. Reviewed PDMP  Redell DELENA Pizza, NP

## 2024-07-12 ENCOUNTER — Ambulatory Visit (INDEPENDENT_AMBULATORY_CARE_PROVIDER_SITE_OTHER): Admitting: Behavioral Health

## 2024-07-12 ENCOUNTER — Encounter: Payer: Self-pay | Admitting: Behavioral Health

## 2024-07-12 DIAGNOSIS — F331 Major depressive disorder, recurrent, moderate: Secondary | ICD-10-CM

## 2024-07-12 DIAGNOSIS — F411 Generalized anxiety disorder: Secondary | ICD-10-CM

## 2024-07-12 MED ORDER — SERTRALINE HCL 100 MG PO TABS: ORAL_TABLET | ORAL | 2 refills | Status: AC

## 2024-07-12 MED ORDER — BUSPIRONE HCL 10 MG PO TABS: 10.0000 mg | ORAL_TABLET | Freq: Two times a day (BID) | ORAL | 2 refills | Status: AC

## 2024-07-12 MED ORDER — LAMOTRIGINE 100 MG PO TABS: 100.0000 mg | ORAL_TABLET | Freq: Every day | ORAL | 1 refills | Status: AC

## 2024-07-12 NOTE — Progress Notes (Signed)
 "     [facility]  Patient ID: Leslie Gonzalez,  MRN: [id number]  PCP: Aisha Harvey, MD  Date of Evaluation: 07/12/2024 Time spent:0 minutes  Chief Complaint:  Chief Complaint   Depression; Anxiety; Follow-up; Medication Refill; Patient Education     HISTORY/CURRENT STATUS: HPI   Leslie Gonzalez, 24 year old female presents to this office for follow up and medication management.  He is reporting some dullness, lack of motivation, and fatigue.  Not sure if some mild depression is developing.  However she is not wish to adjust or change her medications today.  She would like to try a magnesium supplement.   Has mood fluctuation periodically but manageable.    She reports anxiety today at 2/10 and depression at 2/10. She denies mania currently. No mania, no psychosis. No SI/HI.   No previous medication trials       Individual Medical History/ Review of Systems: Changes? :No   Allergies: Patient has no known allergies.  Current Medications: Current Medications[1] Medication Side Effects: none  Family Medical/ Social History: Changes? No  MENTAL HEALTH EXAM:  There were no vitals taken for this visit.There is no height or weight on file to calculate BMI.  General Appearance: Casual, Neat, and Well Groomed  Eye Contact:  Good  Speech:  Clear and Coherent  Volume:  Normal  Mood:  Negative  Affect:  Appropriate  Thought Process:  Coherent  Orientation:  Full (Time, Place, and Person)  Thought Content: Logical   Suicidal Thoughts:  No  Homicidal Thoughts:  No  Memory:  WNL  Judgement:  Good  Insight:  Good  Psychomotor Activity:  Normal  Concentration:  Concentration: Good  Recall:  Good  Fund of Knowledge: Good  Language: Good  Assets:  Desire for Improvement  ADL's:  Intact  Cognition: WNL  Prognosis:  Good    DIAGNOSES:    ICD-10-CM   1. Generalized anxiety disorder  F41.1 busPIRone  (BUSPAR ) 10 MG tablet    lamoTRIgine  (LAMICTAL ) 100 MG tablet     sertraline  (ZOLOFT ) 100 MG tablet    2. Major depressive disorder, recurrent episode, moderate (HCC)  F33.1 lamoTRIgine  (LAMICTAL ) 100 MG tablet    sertraline  (ZOLOFT ) 100 MG tablet      Receiving Psychotherapy: No    RECOMMENDATIONS:   Greater than 50% of 30 min face to face with patient was spent on counseling and coordination of care.  We discussed patient's report of feeling unmotivated and fatigued.  She did have some low normal iron levels on her last labs.  She is taking an iron supplement.  She is interested in trying magnesium glycinate at bedtime.  Pt would like to continue with her current meds with no changes for now.   Counseled patient regarding potential benefits, risks, and side effects of Lamictal  to include potential risk of Stevens-Johnson syndrome. Advised patient to stop taking Lamictal  and contact office immediately if rash develops and to seek urgent medical attention if rash is severe and/or spreading quickly.    Continue Zoloft  to 150 mg daily Continue Lamictal  to 100 mg daily Continue Buspar  10 mg twice daily.  Add magnesium glycinate 300-400 mg at bedtime. Will report any side effects or worsening symptoms promptly. To Follow up in 3 months to reassess. Reviewed PDMP     Leslie DELENA Pizza, NP     [1]  Current Outpatient Medications:    busPIRone  (BUSPAR ) 10 MG tablet, Take 1 tablet (10 mg total) by mouth 2 (two)  times daily after a meal., Disp: 180 tablet, Rfl: 2   lamoTRIgine  (LAMICTAL ) 100 MG tablet, Take 1 tablet (100 mg total) by mouth daily., Disp: 90 tablet, Rfl: 1   norgestimate-ethinyl estradiol (ORTHO-CYCLEN) 0.25-35 MG-MCG tablet, 1 tablet (Patient not taking: Reported on 04/15/2022), Disp: , Rfl:    ondansetron  (ZOFRAN -ODT) 4 MG disintegrating tablet, Take 1 tablet (4 mg total) by mouth every 8 (eight) hours as needed for nausea or vomiting. (Patient not taking: Reported on 04/15/2022), Disp: 12 tablet, Rfl: 0   prochlorperazine  (COMPAZINE ) 25 MG  suppository, Place 1 suppository (25 mg total) rectally every 12 (twelve) hours as needed for nausea or vomiting. (Patient not taking: Reported on 04/15/2022), Disp: 12 suppository, Rfl: 0   sertraline  (ZOLOFT ) 100 MG tablet, TAKE 1.5 TABLETS (150 MG TOTAL) BY MOUTH DAILY, Disp: 135 tablet, Rfl: 2  "

## 2024-10-11 ENCOUNTER — Ambulatory Visit: Admitting: Behavioral Health
